# Patient Record
Sex: Female | Born: 1975 | Race: White | Hispanic: No | Marital: Married | Smoking: Former smoker
Health system: Southern US, Community
[De-identification: ages and names within clinical notes are randomized; demographics above are authoritative.]

## PROBLEM LIST (undated history)

## (undated) DIAGNOSIS — F329 Major depressive disorder, single episode, unspecified: Secondary | ICD-10-CM

## (undated) DIAGNOSIS — F32A Depression, unspecified: Secondary | ICD-10-CM

## (undated) DIAGNOSIS — F988 Other specified behavioral and emotional disorders with onset usually occurring in childhood and adolescence: Secondary | ICD-10-CM

## (undated) DIAGNOSIS — F419 Anxiety disorder, unspecified: Secondary | ICD-10-CM

## (undated) DIAGNOSIS — F4310 Post-traumatic stress disorder, unspecified: Secondary | ICD-10-CM

## (undated) DIAGNOSIS — F319 Bipolar disorder, unspecified: Secondary | ICD-10-CM

## (undated) DIAGNOSIS — L209 Atopic dermatitis, unspecified: Secondary | ICD-10-CM

## (undated) DIAGNOSIS — C801 Malignant (primary) neoplasm, unspecified: Secondary | ICD-10-CM

## (undated) DIAGNOSIS — K219 Gastro-esophageal reflux disease without esophagitis: Secondary | ICD-10-CM

## (undated) DIAGNOSIS — C439 Malignant melanoma of skin, unspecified: Secondary | ICD-10-CM

## (undated) DIAGNOSIS — K625 Hemorrhage of anus and rectum: Secondary | ICD-10-CM

## (undated) HISTORY — DX: Depression, unspecified: F32.A

## (undated) HISTORY — DX: Other specified behavioral and emotional disorders with onset usually occurring in childhood and adolescence: F98.8

## (undated) HISTORY — PX: WISDOM TOOTH EXTRACTION: SHX21

## (undated) HISTORY — DX: Post-traumatic stress disorder, unspecified: F43.10

## (undated) HISTORY — DX: Hemorrhage of anus and rectum: K62.5

## (undated) HISTORY — DX: Anxiety disorder, unspecified: F41.9

## (undated) HISTORY — DX: Gastro-esophageal reflux disease without esophagitis: K21.9

## (undated) HISTORY — DX: Bipolar disorder, unspecified: F31.9

## (undated) HISTORY — PX: TONSILLECTOMY AND ADENOIDECTOMY: SUR1326

## (undated) HISTORY — DX: Major depressive disorder, single episode, unspecified: F32.9

## (undated) HISTORY — DX: Malignant melanoma of skin, unspecified: C43.9

## (undated) HISTORY — DX: Atopic dermatitis, unspecified: L20.9

## (undated) HISTORY — DX: Malignant (primary) neoplasm, unspecified: C80.1

---

## 1998-06-18 ENCOUNTER — Emergency Department (HOSPITAL_COMMUNITY): Admission: EM | Admit: 1998-06-18 | Discharge: 1998-06-18 | Payer: Self-pay | Admitting: Emergency Medicine

## 1998-06-18 ENCOUNTER — Encounter: Payer: Self-pay | Admitting: Emergency Medicine

## 1998-11-21 ENCOUNTER — Other Ambulatory Visit: Admission: RE | Admit: 1998-11-21 | Discharge: 1998-11-21 | Payer: Self-pay | Admitting: Obstetrics and Gynecology

## 1999-03-29 ENCOUNTER — Inpatient Hospital Stay (HOSPITAL_COMMUNITY): Admission: AD | Admit: 1999-03-29 | Discharge: 1999-03-29 | Payer: Self-pay | Admitting: Obstetrics and Gynecology

## 1999-04-18 ENCOUNTER — Inpatient Hospital Stay (HOSPITAL_COMMUNITY): Admission: AD | Admit: 1999-04-18 | Discharge: 1999-04-18 | Payer: Self-pay | Admitting: Obstetrics and Gynecology

## 1999-04-25 ENCOUNTER — Inpatient Hospital Stay (HOSPITAL_COMMUNITY): Admission: AD | Admit: 1999-04-25 | Discharge: 1999-04-25 | Payer: Self-pay | Admitting: Obstetrics and Gynecology

## 1999-04-25 ENCOUNTER — Encounter: Payer: Self-pay | Admitting: Obstetrics and Gynecology

## 1999-05-15 ENCOUNTER — Inpatient Hospital Stay (HOSPITAL_COMMUNITY): Admission: AD | Admit: 1999-05-15 | Discharge: 1999-05-15 | Payer: Self-pay | Admitting: *Deleted

## 1999-06-02 ENCOUNTER — Inpatient Hospital Stay (HOSPITAL_COMMUNITY): Admission: AD | Admit: 1999-06-02 | Discharge: 1999-06-04 | Payer: Self-pay | Admitting: Obstetrics and Gynecology

## 1999-08-28 ENCOUNTER — Other Ambulatory Visit: Admission: RE | Admit: 1999-08-28 | Discharge: 1999-08-28 | Payer: Self-pay | Admitting: Obstetrics and Gynecology

## 2000-01-30 HISTORY — PX: TONSILLECTOMY AND ADENOIDECTOMY: SUR1326

## 2000-06-14 ENCOUNTER — Other Ambulatory Visit: Admission: RE | Admit: 2000-06-14 | Discharge: 2000-06-14 | Payer: Self-pay | Admitting: Family Medicine

## 2000-10-22 ENCOUNTER — Encounter (INDEPENDENT_AMBULATORY_CARE_PROVIDER_SITE_OTHER): Payer: Self-pay | Admitting: *Deleted

## 2000-10-22 ENCOUNTER — Ambulatory Visit (HOSPITAL_BASED_OUTPATIENT_CLINIC_OR_DEPARTMENT_OTHER): Admission: RE | Admit: 2000-10-22 | Discharge: 2000-10-22 | Payer: Self-pay | Admitting: Otolaryngology

## 2002-11-24 ENCOUNTER — Emergency Department (HOSPITAL_COMMUNITY): Admission: EM | Admit: 2002-11-24 | Discharge: 2002-11-24 | Payer: Self-pay | Admitting: Emergency Medicine

## 2003-03-08 ENCOUNTER — Emergency Department (HOSPITAL_COMMUNITY): Admission: EM | Admit: 2003-03-08 | Discharge: 2003-03-08 | Payer: Self-pay | Admitting: Emergency Medicine

## 2003-04-13 ENCOUNTER — Other Ambulatory Visit: Admission: RE | Admit: 2003-04-13 | Discharge: 2003-04-13 | Payer: Self-pay | Admitting: Family Medicine

## 2003-08-17 ENCOUNTER — Emergency Department (HOSPITAL_COMMUNITY): Admission: EM | Admit: 2003-08-17 | Discharge: 2003-08-17 | Payer: Self-pay | Admitting: *Deleted

## 2005-05-16 ENCOUNTER — Other Ambulatory Visit: Admission: RE | Admit: 2005-05-16 | Discharge: 2005-05-16 | Payer: Self-pay | Admitting: Family Medicine

## 2009-03-01 ENCOUNTER — Emergency Department (HOSPITAL_COMMUNITY): Admission: EM | Admit: 2009-03-01 | Discharge: 2009-03-01 | Payer: Self-pay | Admitting: Emergency Medicine

## 2009-05-25 ENCOUNTER — Encounter: Admission: RE | Admit: 2009-05-25 | Discharge: 2009-05-25 | Payer: Self-pay | Admitting: Obstetrics and Gynecology

## 2009-07-06 ENCOUNTER — Ambulatory Visit: Payer: Self-pay | Admitting: Nurse Practitioner

## 2009-07-06 ENCOUNTER — Inpatient Hospital Stay (HOSPITAL_COMMUNITY)
Admission: AD | Admit: 2009-07-06 | Discharge: 2009-07-06 | Payer: Self-pay | Source: Home / Self Care | Admitting: Obstetrics and Gynecology

## 2009-10-04 ENCOUNTER — Inpatient Hospital Stay (HOSPITAL_COMMUNITY): Admission: AD | Admit: 2009-10-04 | Discharge: 2009-10-06 | Payer: Self-pay | Admitting: Obstetrics and Gynecology

## 2010-03-02 ENCOUNTER — Other Ambulatory Visit: Payer: Self-pay | Admitting: *Deleted

## 2010-03-28 ENCOUNTER — Other Ambulatory Visit: Payer: Self-pay | Admitting: Orthopedic Surgery

## 2010-03-28 DIAGNOSIS — M5410 Radiculopathy, site unspecified: Secondary | ICD-10-CM

## 2010-03-28 DIAGNOSIS — M542 Cervicalgia: Secondary | ICD-10-CM

## 2010-04-05 ENCOUNTER — Ambulatory Visit
Admission: RE | Admit: 2010-04-05 | Discharge: 2010-04-05 | Disposition: A | Discharge status: Home / Self Care | Payer: PRIVATE HEALTH INSURANCE | Source: Ambulatory Visit | Attending: Orthopedic Surgery | Admitting: Orthopedic Surgery

## 2010-04-05 DIAGNOSIS — M542 Cervicalgia: Secondary | ICD-10-CM

## 2010-04-05 DIAGNOSIS — M5410 Radiculopathy, site unspecified: Secondary | ICD-10-CM

## 2010-04-13 LAB — CBC
Hemoglobin: 9.3 g/dL — ABNORMAL LOW (ref 12.0–15.0)
Hemoglobin: 9.9 g/dL — ABNORMAL LOW (ref 12.0–15.0)
MCH: 30.9 pg (ref 26.0–34.0)
MCHC: 34.5 g/dL (ref 30.0–36.0)
Platelets: 224 10*3/uL (ref 150–400)
RBC: 3.2 MIL/uL — ABNORMAL LOW (ref 3.87–5.11)
RDW: 14.6 % (ref 11.5–15.5)
WBC: 12.8 10*3/uL — ABNORMAL HIGH (ref 4.0–10.5)

## 2010-04-13 LAB — RPR: RPR Ser Ql: NONREACTIVE

## 2010-04-17 LAB — CBC
Platelets: 211 10*3/uL (ref 150–400)
RBC: 3.05 MIL/uL — ABNORMAL LOW (ref 3.87–5.11)
WBC: 10.9 10*3/uL — ABNORMAL HIGH (ref 4.0–10.5)

## 2010-04-17 LAB — COMPREHENSIVE METABOLIC PANEL
ALT: 10 U/L (ref 0–35)
AST: 17 U/L (ref 0–37)
Albumin: 2.9 g/dL — ABNORMAL LOW (ref 3.5–5.2)
Alkaline Phosphatase: 58 U/L (ref 39–117)
Chloride: 106 mEq/L (ref 96–112)
GFR calc Af Amer: >60 mL/min (ref >60)
Potassium: 3.9 mEq/L (ref 3.5–5.1)
Total Bilirubin: 0.3 mg/dL (ref 0.3–1.2)

## 2010-04-17 LAB — URINALYSIS, ROUTINE W REFLEX MICROSCOPIC
Glucose, UA: NEGATIVE mg/dL
Protein, ur: NEGATIVE mg/dL
Specific Gravity, Urine: 1.025 (ref 1.005–1.030)
Urobilinogen, UA: 0.2 mg/dL (ref 0.0–1.0)

## 2010-04-19 LAB — CBC
HCT: 39.2 % (ref 36.0–46.0)
Hemoglobin: 13.3 g/dL (ref 12.0–15.0)
MCHC: 34 g/dL (ref 30.0–36.0)
MCV: 92 fL (ref 78.0–100.0)
Platelets: 219 K/uL (ref 150–400)
RBC: 4.26 MIL/uL (ref 3.87–5.11)
RDW: 12.9 % (ref 11.5–15.5)
WBC: 10.2 10*3/uL (ref 4.0–10.5)

## 2010-04-19 LAB — WET PREP, GENITAL
Trich, Wet Prep: NONE SEEN
Yeast Wet Prep HPF POC: NONE SEEN

## 2010-04-19 LAB — DIFFERENTIAL
Basophils Absolute: 0 K/uL (ref 0.0–0.1)
Basophils Relative: 0 % (ref 0–1)
Eosinophils Absolute: 0 K/uL (ref 0.0–0.7)
Eosinophils Relative: 0 % (ref 0–5)
Lymphocytes Relative: 24 % (ref 12–46)
Lymphs Abs: 2.4 10*3/uL (ref 0.7–4.0)
Monocytes Absolute: 0.6 10*3/uL (ref 0.1–1.0)
Monocytes Relative: 6 % (ref 3–12)
Neutro Abs: 7.1 10*3/uL (ref 1.7–7.7)
Neutrophils Relative %: 70 % (ref 43–77)

## 2010-04-19 LAB — URINALYSIS, ROUTINE W REFLEX MICROSCOPIC
Hgb urine dipstick: NEGATIVE
Protein, ur: NEGATIVE mg/dL
Urobilinogen, UA: 0.2 mg/dL (ref 0.0–1.0)

## 2010-04-19 LAB — GC/CHLAMYDIA PROBE AMP, GENITAL
Chlamydia, DNA Probe: NEGATIVE
GC Probe Amp, Genital: NEGATIVE

## 2010-04-19 LAB — POCT PREGNANCY, URINE: Preg Test, Ur: POSITIVE

## 2010-04-19 LAB — ABO/RH: ABO/RH(D): O POS

## 2010-04-19 LAB — HCG, QUANTITATIVE, PREGNANCY: hCG, Beta Chain, Quant, S: 6456 m[IU]/mL — ABNORMAL HIGH (ref <5)

## 2010-06-16 NOTE — Op Note (Signed)
[place]. [facility]  Patient:    DOROTHIE, WAH Visit Number: [id number] MRN: [id number]          Service Type: DSU Location: [facility] Attending Physician:  Carlean Purl Dictated by:   Kristine Garbe Ezzard Standing, M.D. Proc. Date: 10/22/00 Admit Date:  10/22/2000   CC:         Arvella Merles, M.D.   Operative Report  PREOPERATIVE DIAGNOSIS:  Recurrent chronic tonsillitis.  POSTOPERATIVE DIAGNOSIS:  Recurrent chronic tonsillitis.  OPERATION:  Tonsillectomy and adenoidectomy.  SURGEON:  Kristine Garbe. Ezzard Standing, M.D.  ANESTHESIA:  General endotracheal.  COMPLICATIONS:  None.  BRIEF CLINICAL NOTE:  Cordelia Pen is a 35 year old female who has had history of frequent of upper respiratory infections, as well as sore throat.  On examination she had 2+ size tonsils which are cryptic.  She is taking to the operating room at this time for tonsillectomy and possible adenoidectomy.  DESCRIPTION OF PROCEDURE:  After adequate endotracheal anesthesia, mouth gag was used to expose the oropharynx.  Of note, the patient received 1 g Ancef IV preoperatively, as well as 8 mg Decadron IV preoperatively.  The tonsils were dissected from the tonsil fossae using electrocautery.  Care was taken to preserve the anterior and posterior tonsillar pillars, as well as the uvula. Hemostasis was obtained with cautery.  Following this, a red rubber catheter was passed through the nose and out the mouth to retract soft palate.  The nasopharynx was examined.  Cordelia Pen had moderately large adenoid tissue.  A large adenoid curet was used to remove the central pad of adenoid tissue. Nasopharyngeal packs were placed for hemostasis.  These were then removed. Full hemostasis was obtained with suction cautery.  After obtaining adequate hemostasis the procedure was completed.  Cordelia Pen was awakened from anesthesia and transferred to recovery room; postoperatively is doing well.  DISPOSITION:   Cordelia Pen is discharged to home later this morning; on amoxicillin suspension 500 mg b.i.d. for one week, Tylenol elixir 15-30 cc q.4h. p.r.n. pain, Phenergan p.r.n. nausea.  I will have her follow up in my office in 10-14 days for recheck. Dictated by:   Kristine Garbe Ezzard Standing, M.D. Attending Physician:  Carlean Purl DD:  10/22/00 TD:  10/22/00 Job: [id number]

## 2011-07-27 ENCOUNTER — Other Ambulatory Visit: Payer: Self-pay

## 2011-12-06 ENCOUNTER — Encounter: Payer: Self-pay | Admitting: Gastroenterology

## 2011-12-06 ENCOUNTER — Encounter: Payer: Self-pay | Admitting: Internal Medicine

## 2011-12-06 ENCOUNTER — Ambulatory Visit (INDEPENDENT_AMBULATORY_CARE_PROVIDER_SITE_OTHER): Payer: PRIVATE HEALTH INSURANCE | Admitting: Internal Medicine

## 2011-12-06 ENCOUNTER — Other Ambulatory Visit (INDEPENDENT_AMBULATORY_CARE_PROVIDER_SITE_OTHER): Payer: PRIVATE HEALTH INSURANCE

## 2011-12-06 VITALS — BP 116/72 | HR 79 | Temp 97.8°F | Resp 16 | Ht 65.0 in | Wt 138.0 lb

## 2011-12-06 DIAGNOSIS — Z0000 Encounter for general adult medical examination without abnormal findings: Secondary | ICD-10-CM

## 2011-12-06 DIAGNOSIS — Z23 Encounter for immunization: Secondary | ICD-10-CM

## 2011-12-06 DIAGNOSIS — R1310 Dysphagia, unspecified: Secondary | ICD-10-CM

## 2011-12-06 LAB — CBC WITH DIFFERENTIAL/PLATELET
Basophils Relative: 0.6 % (ref 0.0–3.0)
Eosinophils Relative: 1.2 % (ref 0.0–5.0)
HCT: 42.5 % (ref 36.0–46.0)
Hemoglobin: 14.2 g/dL (ref 12.0–15.0)
Lymphs Abs: 2.7 10*3/uL (ref 0.7–4.0)
MCV: 91.9 fl (ref 78.0–100.0)
Monocytes Relative: 5.1 % (ref 3.0–12.0)
Neutro Abs: 4 10*3/uL (ref 1.4–7.7)
Platelets: 203 10*3/uL (ref 150.0–400.0)
RBC: 4.62 Mil/uL (ref 3.87–5.11)
WBC: 7.2 10*3/uL (ref 4.5–10.5)

## 2011-12-06 LAB — COMPREHENSIVE METABOLIC PANEL
Alkaline Phosphatase: 47 U/L (ref 39–117)
BUN: 7 mg/dL (ref 6–23)
CO2: 27 mEq/L (ref 19–32)
Creatinine, Ser: 0.8 mg/dL (ref 0.4–1.2)
GFR: 89.22 mL/min (ref >60.00)
Glucose, Bld: 85 mg/dL (ref 70–99)
Total Bilirubin: 2.3 mg/dL — ABNORMAL HIGH (ref 0.3–1.2)

## 2011-12-06 LAB — TSH: TSH: 1.01 u[IU]/mL (ref 0.35–5.50)

## 2011-12-06 LAB — LIPID PANEL
HDL: 44.9 mg/dL (ref >39.00)
Triglycerides: 37 mg/dL (ref 0.0–149.0)
VLDL: 7.4 mg/dL (ref 0.0–40.0)

## 2011-12-06 NOTE — Assessment & Plan Note (Signed)
I have asked her to see GI, she does not want a med for heartburn

## 2011-12-06 NOTE — Assessment & Plan Note (Signed)
Exam done, vaccines were updated, labs ordered, pt ed material was given 

## 2011-12-06 NOTE — Progress Notes (Signed)
Subjective:    Patient ID: Wendy Hawkins, female    DOB: Jul 14, 1975, 36 y.o.   MRN: [id number]  Gastrophageal Reflux She complains of abdominal pain (epigastric pain for about 2 years), dysphagia and early satiety. She reports no belching, no chest pain, no choking, no coughing, no globus sensation, no heartburn, no hoarse voice, no nausea, no sore throat, no stridor, no tooth decay, no water brash or no wheezing. This is a chronic problem. The current episode started more than 1 year ago. The problem occurs rarely. The problem has been gradually improving. The symptoms are aggravated by smoking. Associated symptoms include weight loss. Pertinent negatives include no anemia, fatigue, melena, muscle weakness or orthopnea. Risk factors include smoking/tobacco exposure. She has tried nothing for the symptoms.      Review of Systems  Constitutional: Positive for weight loss and unexpected weight change. Negative for fever, chills, diaphoresis, activity change, appetite change and fatigue.  HENT: Positive for trouble swallowing. Negative for sore throat, hoarse voice and voice change.   Eyes: Negative.   Respiratory: Negative for cough, choking, shortness of breath, wheezing and stridor.   Cardiovascular: Negative for chest pain, palpitations and leg swelling.  Gastrointestinal: Positive for dysphagia and abdominal pain (epigastric pain for about 2 years). Negative for heartburn, nausea, vomiting, diarrhea, constipation, blood in stool, melena, abdominal distention, anal bleeding and rectal pain.  Genitourinary: Negative.   Musculoskeletal: Negative for myalgias, back pain, joint swelling, arthralgias, gait problem and muscle weakness.  Skin: Negative.   Neurological: Negative.   Hematological: Negative for adenopathy. Does not bruise/bleed easily.  Psychiatric/Behavioral: Negative for suicidal ideas, hallucinations, behavioral problems, confusion, sleep disturbance, self-injury, dysphoric mood,  decreased concentration and agitation. The patient is nervous/anxious. The patient is not hyperactive.        Objective:   Physical Exam  Vitals reviewed. Constitutional: She is oriented to person, place, and time. She appears well-developed and well-nourished. No distress.  HENT:  Head: Normocephalic and atraumatic.  Mouth/Throat: Oropharynx is clear and moist. No oropharyngeal exudate.  Eyes: Conjunctivae normal are normal. Right eye exhibits no discharge. Left eye exhibits no discharge. No scleral icterus.  Neck: Normal range of motion. Neck supple. No JVD present. No tracheal deviation present. No thyromegaly present.  Cardiovascular: Normal rate, regular rhythm, normal heart sounds and intact distal pulses.  Exam reveals no gallop and no friction rub.   No murmur heard. Pulmonary/Chest: Effort normal and breath sounds normal. No stridor. No respiratory distress. She has no wheezes. She has no rales. She exhibits no tenderness.  Abdominal: Soft. Bowel sounds are normal. She exhibits no distension and no mass. There is no tenderness. There is no rebound and no guarding.  Musculoskeletal: Normal range of motion. She exhibits no edema and no tenderness.  Lymphadenopathy:    She has no cervical adenopathy.  Neurological: She is oriented to person, place, and time.  Skin: Skin is warm and dry. No rash noted. She is not diaphoretic. No erythema. No pallor.  Psychiatric: She has a normal mood and affect. Her behavior is normal. Judgment and thought content normal.     Lab Results  Component Value Date   WBC 12.8* 10/05/2009   HGB 9.3* 10/05/2009   HCT 27.1* 10/05/2009   PLT 220 10/05/2009   GLUCOSE 95 07/06/2009   ALT 10 07/06/2009   AST 17 07/06/2009   NA 137 07/06/2009   K 3.9 07/06/2009   CL 106 07/06/2009   CREATININE 0.47 07/06/2009   BUN 3* 07/06/2009  CO2 25 07/06/2009       Assessment & Plan:

## 2011-12-06 NOTE — Patient Instructions (Signed)
Preventive Care for Adults, Female A healthy lifestyle and preventive care can promote health and wellness. Preventive health guidelines for women include the following key practices.  A routine yearly physical is a good way to check with your caregiver about your health and preventive screening. It is a chance to share any concerns and updates on your health, and to receive a thorough exam.  Visit your dentist for a routine exam and preventive care every 6 months. Brush your teeth twice a day and floss once a day. Good oral hygiene prevents tooth decay and gum disease.  The frequency of eye exams is based on your age, health, family medical history, use of contact lenses, and other factors. Follow your caregiver's recommendations for frequency of eye exams.  Eat a healthy diet. Foods like vegetables, fruits, whole grains, low-fat dairy products, and lean protein foods contain the nutrients you need without too many calories. Decrease your intake of foods high in solid fats, added sugars, and salt. Eat the right amount of calories for you.Get information about a proper diet from your caregiver, if necessary.  Regular physical exercise is one of the most important things you can do for your health. Most adults should get at least 150 minutes of moderate-intensity exercise (any activity that increases your heart rate and causes you to sweat) each week. In addition, most adults need muscle-strengthening exercises on 2 or more days a week.  Maintain a healthy weight. The body mass index (BMI) is a screening tool to identify possible weight problems. It provides an estimate of body fat based on height and weight. Your caregiver can help determine your BMI, and can help you achieve or maintain a healthy weight.For adults 20 years and older:  A BMI below 18.5 is considered underweight.  A BMI of 18.5 to 24.9 is normal.  A BMI of 25 to 29.9 is considered overweight.  A BMI of 30 and above is  considered obese.  Maintain normal blood lipids and cholesterol levels by exercising and minimizing your intake of saturated fat. Eat a balanced diet with plenty of fruit and vegetables. Blood tests for lipids and cholesterol should begin at age 20 and be repeated every 5 years. If your lipid or cholesterol levels are high, you are over 50, or you are at high risk for heart disease, you may need your cholesterol levels checked more frequently.Ongoing high lipid and cholesterol levels should be treated with medicines if diet and exercise are not effective.  If you smoke, find out from your caregiver how to quit. If you do not use tobacco, do not start.  If you are pregnant, do not drink alcohol. If you are breastfeeding, be very cautious about drinking alcohol. If you are not pregnant and choose to drink alcohol, do not exceed 1 drink per day. One drink is considered to be 12 ounces (355 mL) of beer, 5 ounces (148 mL) of wine, or 1.5 ounces (44 mL) of liquor.  Avoid use of street drugs. Do not share needles with anyone. Ask for help if you need support or instructions about stopping the use of drugs.  High blood pressure causes heart disease and increases the risk of stroke. Your blood pressure should be checked at least every 1 to 2 years. Ongoing high blood pressure should be treated with medicines if weight loss and exercise are not effective.  If you are 55 to 35 years old, ask your caregiver if you should take aspirin to prevent strokes.  Diabetes   screening involves taking a blood sample to check your fasting blood sugar level. This should be done once every 3 years, after age 45, if you are within normal weight and without risk factors for diabetes. Testing should be considered at a younger age or be carried out more frequently if you are overweight and have at least 1 risk factor for diabetes.  Breast cancer screening is essential preventive care for women. You should practice "breast  self-awareness." This means understanding the normal appearance and feel of your breasts and may include breast self-examination. Any changes detected, no matter how small, should be reported to a caregiver. Women in their 20s and 30s should have a clinical breast exam (CBE) by a caregiver as part of a regular health exam every 1 to 3 years. After age 40, women should have a CBE every year. Starting at age 40, women should consider having a mammography (breast X-ray test) every year. Women who have a family history of breast cancer should talk to their caregiver about genetic screening. Women at a high risk of breast cancer should talk to their caregivers about having magnetic resonance imaging (MRI) and a mammography every year.  The Pap test is a screening test for cervical cancer. A Pap test can show cell changes on the cervix that might become cervical cancer if left untreated. A Pap test is a procedure in which cells are obtained and examined from the lower end of the uterus (cervix).  Women should have a Pap test starting at age 21.  Between ages 21 and 29, Pap tests should be repeated every 2 years.  Beginning at age 30, you should have a Pap test every 3 years as long as the past 3 Pap tests have been normal.  Some women have medical problems that increase the chance of getting cervical cancer. Talk to your caregiver about these problems. It is especially important to talk to your caregiver if a new problem develops soon after your last Pap test. In these cases, your caregiver may recommend more frequent screening and Pap tests.  The above recommendations are the same for women who have or have not gotten the vaccine for human papillomavirus (HPV).  If you had a hysterectomy for a problem that was not cancer or a condition that could lead to cancer, then you no longer need Pap tests. Even if you no longer need a Pap test, a regular exam is a good idea to make sure no other problems are  starting.  If you are between ages 65 and 70, and you have had normal Pap tests going back 10 years, you no longer need Pap tests. Even if you no longer need a Pap test, a regular exam is a good idea to make sure no other problems are starting.  If you have had past treatment for cervical cancer or a condition that could lead to cancer, you need Pap tests and screening for cancer for at least 20 years after your treatment.  If Pap tests have been discontinued, risk factors (such as a new sexual partner) need to be reassessed to determine if screening should be resumed.  The HPV test is an additional test that may be used for cervical cancer screening. The HPV test looks for the virus that can cause the cell changes on the cervix. The cells collected during the Pap test can be tested for HPV. The HPV test could be used to screen women aged 30 years and older, and should   be used in women of any age who have unclear Pap test results. After the age of 30, women should have HPV testing at the same frequency as a Pap test.  Colorectal cancer can be detected and often prevented. Most routine colorectal cancer screening begins at the age of 50 and continues through age 75. However, your caregiver may recommend screening at an earlier age if you have risk factors for colon cancer. On a yearly basis, your caregiver may provide home test kits to check for hidden blood in the stool. Use of a small camera at the end of a tube, to directly examine the colon (sigmoidoscopy or colonoscopy), can detect the earliest forms of colorectal cancer. Talk to your caregiver about this at age 50, when routine screening begins. Direct examination of the colon should be repeated every 5 to 10 years through age 75, unless early forms of pre-cancerous polyps or small growths are found.  Hepatitis C blood testing is recommended for all people born from 1945 through 1965 and any individual with known risks for hepatitis C.  Practice  safe sex. Use condoms and avoid high-risk sexual practices to reduce the spread of sexually transmitted infections (STIs). STIs include gonorrhea, chlamydia, syphilis, trichomonas, herpes, HPV, and human immunodeficiency virus (HIV). Herpes, HIV, and HPV are viral illnesses that have no cure. They can result in disability, cancer, and death. Sexually active women aged 25 and younger should be checked for chlamydia. Older women with new or multiple partners should also be tested for chlamydia. Testing for other STIs is recommended if you are sexually active and at increased risk.  Osteoporosis is a disease in which the bones lose minerals and strength with aging. This can result in serious bone fractures. The risk of osteoporosis can be identified using a bone density scan. Women ages 65 and over and women at risk for fractures or osteoporosis should discuss screening with their caregivers. Ask your caregiver whether you should take a calcium supplement or vitamin D to reduce the rate of osteoporosis.  Menopause can be associated with physical symptoms and risks. Hormone replacement therapy is available to decrease symptoms and risks. You should talk to your caregiver about whether hormone replacement therapy is right for you.  Use sunscreen with sun protection factor (SPF) of 30 or more. Apply sunscreen liberally and repeatedly throughout the day. You should seek shade when your shadow is shorter than you. Protect yourself by wearing long sleeves, pants, a wide-brimmed hat, and sunglasses year round, whenever you are outdoors.  Once a month, do a whole body skin exam, using a mirror to look at the skin on your back. Notify your caregiver of new moles, moles that have irregular borders, moles that are larger than a pencil eraser, or moles that have changed in shape or color.  Stay current with required immunizations.  Influenza. You need a dose every fall (or winter). The composition of the flu vaccine  changes each year, so being vaccinated once is not enough.  Pneumococcal polysaccharide. You need 1 to 2 doses if you smoke cigarettes or if you have certain chronic medical conditions. You need 1 dose at age 65 (or older) if you have never been vaccinated.  Tetanus, diphtheria, pertussis (Tdap, Td). Get 1 dose of Tdap vaccine if you are younger than age 65, are over 65 and have contact with an infant, are a healthcare worker, are pregnant, or simply want to be protected from whooping cough. After that, you need a Td   booster dose every 10 years. Consult your caregiver if you have not had at least 3 tetanus and diphtheria-containing shots sometime in your life or have a deep or dirty wound.  HPV. You need this vaccine if you are a woman age 26 or younger. The vaccine is given in 3 doses over 6 months.  Measles, mumps, rubella (MMR). You need at least 1 dose of MMR if you were born in 1957 or later. You may also need a second dose.  Meningococcal. If you are age 19 to 21 and a first-year college student living in a residence hall, or have one of several medical conditions, you need to get vaccinated against meningococcal disease. You may also need additional booster doses.  Zoster (shingles). If you are age 60 or older, you should get this vaccine.  Varicella (chickenpox). If you have never had chickenpox or you were vaccinated but received only 1 dose, talk to your caregiver to find out if you need this vaccine.  Hepatitis A. You need this vaccine if you have a specific risk factor for hepatitis A virus infection or you simply wish to be protected from this disease. The vaccine is usually given as 2 doses, 6 to 18 months apart.  Hepatitis B. You need this vaccine if you have a specific risk factor for hepatitis B virus infection or you simply wish to be protected from this disease. The vaccine is given in 3 doses, usually over 6 months. Preventive Services / Frequency Ages 19 to 39  Blood  pressure check.** / Every 1 to 2 years.  Lipid and cholesterol check.** / Every 5 years beginning at age 20.  Clinical breast exam.** / Every 3 years for women in their 20s and 30s.  Pap test.** / Every 2 years from ages 21 through 29. Every 3 years starting at age 30 through age 65 or 70 with a history of 3 consecutive normal Pap tests.  HPV screening.** / Every 3 years from ages 30 through ages 65 to 70 with a history of 3 consecutive normal Pap tests.  Hepatitis C blood test.** / For any individual with known risks for hepatitis C.  Skin self-exam. / Monthly.  Influenza immunization.** / Every year.  Pneumococcal polysaccharide immunization.** / 1 to 2 doses if you smoke cigarettes or if you have certain chronic medical conditions.  Tetanus, diphtheria, pertussis (Tdap, Td) immunization. / A one-time dose of Tdap vaccine. After that, you need a Td booster dose every 10 years.  HPV immunization. / 3 doses over 6 months, if you are 26 and younger.  Measles, mumps, rubella (MMR) immunization. / You need at least 1 dose of MMR if you were born in 1957 or later. You may also need a second dose.  Meningococcal immunization. / 1 dose if you are age 19 to 21 and a first-year college student living in a residence hall, or have one of several medical conditions, you need to get vaccinated against meningococcal disease. You may also need additional booster doses.  Varicella immunization.** / Consult your caregiver.  Hepatitis A immunization.** / Consult your caregiver. 2 doses, 6 to 18 months apart.  Hepatitis B immunization.** / Consult your caregiver. 3 doses usually over 6 months. Ages 40 to 64  Blood pressure check.** / Every 1 to 2 years.  Lipid and cholesterol check.** / Every 5 years beginning at age 20.  Clinical breast exam.** / Every year after age 40.  Mammogram.** / Every year beginning at age 40   and continuing for as long as you are in good health. Consult with your  caregiver.  Pap test.** / Every 3 years starting at age 30 through age 65 or 70 with a history of 3 consecutive normal Pap tests.  HPV screening.** / Every 3 years from ages 30 through ages 65 to 70 with a history of 3 consecutive normal Pap tests.  Fecal occult blood test (FOBT) of stool. / Every year beginning at age 50 and continuing until age 75. You may not need to do this test if you get a colonoscopy every 10 years.  Flexible sigmoidoscopy or colonoscopy.** / Every 5 years for a flexible sigmoidoscopy or every 10 years for a colonoscopy beginning at age 50 and continuing until age 75.  Hepatitis C blood test.** / For all people born from 1945 through 1965 and any individual with known risks for hepatitis C.  Skin self-exam. / Monthly.  Influenza immunization.** / Every year.  Pneumococcal polysaccharide immunization.** / 1 to 2 doses if you smoke cigarettes or if you have certain chronic medical conditions.  Tetanus, diphtheria, pertussis (Tdap, Td) immunization.** / A one-time dose of Tdap vaccine. After that, you need a Td booster dose every 10 years.  Measles, mumps, rubella (MMR) immunization. / You need at least 1 dose of MMR if you were born in 1957 or later. You may also need a second dose.  Varicella immunization.** / Consult your caregiver.  Meningococcal immunization.** / Consult your caregiver.  Hepatitis A immunization.** / Consult your caregiver. 2 doses, 6 to 18 months apart.  Hepatitis B immunization.** / Consult your caregiver. 3 doses, usually over 6 months. Ages 65 and over  Blood pressure check.** / Every 1 to 2 years.  Lipid and cholesterol check.** / Every 5 years beginning at age 20.  Clinical breast exam.** / Every year after age 40.  Mammogram.** / Every year beginning at age 40 and continuing for as long as you are in good health. Consult with your caregiver.  Pap test.** / Every 3 years starting at age 30 through age 65 or 70 with a 3  consecutive normal Pap tests. Testing can be stopped between 65 and 70 with 3 consecutive normal Pap tests and no abnormal Pap or HPV tests in the past 10 years.  HPV screening.** / Every 3 years from ages 30 through ages 65 or 70 with a history of 3 consecutive normal Pap tests. Testing can be stopped between 65 and 70 with 3 consecutive normal Pap tests and no abnormal Pap or HPV tests in the past 10 years.  Fecal occult blood test (FOBT) of stool. / Every year beginning at age 50 and continuing until age 75. You may not need to do this test if you get a colonoscopy every 10 years.  Flexible sigmoidoscopy or colonoscopy.** / Every 5 years for a flexible sigmoidoscopy or every 10 years for a colonoscopy beginning at age 50 and continuing until age 75.  Hepatitis C blood test.** / For all people born from 1945 through 1965 and any individual with known risks for hepatitis C.  Osteoporosis screening.** / A one-time screening for women ages 65 and over and women at risk for fractures or osteoporosis.  Skin self-exam. / Monthly.  Influenza immunization.** / Every year.  Pneumococcal polysaccharide immunization.** / 1 dose at age 65 (or older) if you have never been vaccinated.  Tetanus, diphtheria, pertussis (Tdap, Td) immunization. / A one-time dose of Tdap vaccine if you are over   65 and have contact with an infant, are a healthcare worker, or simply want to be protected from whooping cough. After that, you need a Td booster dose every 10 years.  Varicella immunization.** / Consult your caregiver.  Meningococcal immunization.** / Consult your caregiver.  Hepatitis A immunization.** / Consult your caregiver. 2 doses, 6 to 18 months apart.  Hepatitis B immunization.** / Check with your caregiver. 3 doses, usually over 6 months. ** Family history and personal history of risk and conditions may change your caregiver's recommendations. Document Released: 03/13/2001 Document Revised: 04/09/2011  Document Reviewed: 06/12/2010 [employer] Patient Information 2013 [employer]. Smoking Cessation Quitting smoking is important to your health and has many advantages. However, it is not always easy to quit since nicotine is a very addictive drug. Often times, people try 3 times or more before being able to quit. This document explains the best ways for you to prepare to quit smoking. Quitting takes hard work and a lot of effort, but you can do it. ADVANTAGES OF QUITTING SMOKING  You will live longer, feel better, and live better.  Your body will feel the impact of quitting smoking almost immediately.  Within 20 minutes, blood pressure decreases. Your pulse returns to its normal level.  After 8 hours, carbon monoxide levels in the blood return to normal. Your oxygen level increases.  After 24 hours, the chance of having a heart attack starts to decrease. Your breath, hair, and body stop smelling like smoke.  After 48 hours, damaged nerve endings begin to recover. Your sense of taste and smell improve.  After 72 hours, the body is virtually free of nicotine. Your bronchial tubes relax and breathing becomes easier.  After 2 to 12 weeks, lungs can hold more air. Exercise becomes easier and circulation improves.  The risk of having a heart attack, stroke, cancer, or lung disease is greatly reduced.  After 1 year, the risk of coronary heart disease is cut in half.  After 5 years, the risk of stroke falls to the same as a nonsmoker.  After 10 years, the risk of lung cancer is cut in half and the risk of other cancers decreases significantly.  After 15 years, the risk of coronary heart disease drops, usually to the level of a nonsmoker.  If you are pregnant, quitting smoking will improve your chances of having a healthy baby.  The people you live with, especially any children, will be healthier.  You will have extra money to spend on things other than cigarettes. QUESTIONS TO THINK  ABOUT BEFORE ATTEMPTING TO QUIT You may want to talk about your answers with your caregiver.  Why do you want to quit?  If you tried to quit in the past, what helped and what did not?  What will be the most difficult situations for you after you quit? How will you plan to handle them?  Who can help you through the tough times? Your family? Friends? A caregiver?  What pleasures do you get from smoking? What ways can you still get pleasure if you quit? Here are some questions to ask your caregiver:  How can you help me to be successful at quitting?  What medicine do you think would be best for me and how should I take it?  What should I do if I need more help?  What is smoking withdrawal like? How can I get information on withdrawal? GET READY  Set a quit date.  Change your environment by getting rid of   all cigarettes, ashtrays, matches, and lighters in your home, car, or work. Do not let people smoke in your home.  Review your past attempts to quit. Think about what worked and what did not. GET SUPPORT AND ENCOURAGEMENT You have a better chance of being successful if you have help. You can get support in many ways.  Tell your family, friends, and co-workers that you are going to quit and need their support. Ask them not to smoke around you.  Get individual, group, or telephone counseling and support. Programs are available at local hospitals and health centers. Call your local health department for information about programs in your area.  Spiritual beliefs and practices may help some smokers quit.  Download a "quit meter" on your computer to keep track of quit statistics, such as how long you have gone without smoking, cigarettes not smoked, and money saved.  Get a self-help book about quitting smoking and staying off of tobacco. LEARN NEW SKILLS AND BEHAVIORS  Distract yourself from urges to smoke. Talk to someone, go for a walk, or occupy your time with a task.  Change  your normal routine. Take a different route to work. Drink tea instead of coffee. Eat breakfast in a different place.  Reduce your stress. Take a hot bath, exercise, or read a book.  Plan something enjoyable to do every day. Reward yourself for not smoking.  Explore interactive web-based programs that specialize in helping you quit. GET MEDICINE AND USE IT CORRECTLY Medicines can help you stop smoking and decrease the urge to smoke. Combining medicine with the above behavioral methods and support can greatly increase your chances of successfully quitting smoking.  Nicotine replacement therapy helps deliver nicotine to your body without the negative effects and risks of smoking. Nicotine replacement therapy includes nicotine gum, lozenges, inhalers, nasal sprays, and skin patches. Some may be available over-the-counter and others require a prescription.  Antidepressant medicine helps people abstain from smoking, but how this works is unknown. This medicine is available by prescription.  Nicotinic receptor partial agonist medicine simulates the effect of nicotine in your brain. This medicine is available by prescription. Ask your caregiver for advice about which medicines to use and how to use them based on your health history. Your caregiver will tell you what side effects to look out for if you choose to be on a medicine or therapy. Carefully read the information on the package. Do not use any other product containing nicotine while using a nicotine replacement product.  RELAPSE OR DIFFICULT SITUATIONS Most relapses occur within the first 3 months after quitting. Do not be discouraged if you start smoking again. Remember, most people try several times before finally quitting. You may have symptoms of withdrawal because your body is used to nicotine. You may crave cigarettes, be irritable, feel very hungry, cough often, get headaches, or have difficulty concentrating. The withdrawal symptoms are only  temporary. They are strongest when you first quit, but they will go away within 10 14 days. To reduce the chances of relapse, try to:  Avoid drinking alcohol. Drinking lowers your chances of successfully quitting.  Reduce the amount of caffeine you consume. Once you quit smoking, the amount of caffeine in your body increases and can give you symptoms, such as a rapid heartbeat, sweating, and anxiety.  Avoid smokers because they can make you want to smoke.  Do not let weight gain distract you. Many smokers will gain weight when they quit, usually less than 10 pounds.   Eat a healthy diet and stay active. You can always lose the weight gained after you quit.  Find ways to improve your mood other than smoking. FOR MORE INFORMATION  www.smokefree.gov  Document Released: 01/09/2001 Document Revised: 07/17/2011 Document Reviewed: 04/26/2011 [employer] Patient Information 2013 [employer].  

## 2011-12-10 ENCOUNTER — Telehealth: Payer: Self-pay

## 2011-12-10 NOTE — Telephone Encounter (Signed)
Dr Yetta Barre has reviewed the labs. Bilirubin may go up sometimes temporarily. Keep regular f/up appt w/Dr Geralyn Flash

## 2011-12-10 NOTE — Telephone Encounter (Signed)
Patient called stating that she received a lab result letter that showd an out of range bilirubin of 2.3. She would like to know what does this mean and what she need to do about it. Please advise Thanks

## 2011-12-11 NOTE — Telephone Encounter (Signed)
Patient notified per MD.

## 2012-01-01 ENCOUNTER — Encounter: Payer: Self-pay | Admitting: Gastroenterology

## 2012-01-01 ENCOUNTER — Ambulatory Visit (INDEPENDENT_AMBULATORY_CARE_PROVIDER_SITE_OTHER): Payer: PRIVATE HEALTH INSURANCE | Admitting: Gastroenterology

## 2012-01-01 VITALS — BP 132/90 | HR 92 | Temp 98.2°F | Ht 65.0 in | Wt 141.0 lb

## 2012-01-01 DIAGNOSIS — R1310 Dysphagia, unspecified: Secondary | ICD-10-CM

## 2012-01-01 DIAGNOSIS — M949 Disorder of cartilage, unspecified: Secondary | ICD-10-CM

## 2012-01-01 DIAGNOSIS — R079 Chest pain, unspecified: Secondary | ICD-10-CM

## 2012-01-01 DIAGNOSIS — R0789 Other chest pain: Secondary | ICD-10-CM

## 2012-01-01 DIAGNOSIS — M899 Disorder of bone, unspecified: Secondary | ICD-10-CM

## 2012-01-01 MED ORDER — SULINDAC 200 MG PO TABS: 200.0000 mg | ORAL_TABLET | Freq: Two times a day (BID) | ORAL | Status: DC

## 2012-01-01 NOTE — Progress Notes (Signed)
History of Present Illness: 36yo WF here for evaluation of lower chest pain.  Symptoms began 1 year ago.  Pain may be spontaneous with radiation to the RUQ.  She was told to have inflammation of the xyphoid process and given antiinflammatories for 1 month without relief.  Pain may worsen with lifting or by palpating area.  She denies pyrosis but my have some dysphagia to solids, with mild odynophagia.  She denies nausea or vomiting.    Past Medical History  Diagnosis Date  . GERD (gastroesophageal reflux disease)   . Anxiety   . Depression     Dr. Marisue Brooklyn   Past Surgical History  Procedure Date  . Tonsillectomy and adenoidectomy   . Wisdom tooth extraction    family history includes Heart disease in her father; Hypertension in her father; and Lung cancer in her father.  There is no history of Alcohol abuse, and Diabetes, and Drug abuse, and Early death, and Hyperlipidemia, and Kidney disease, and Stroke, . Current Outpatient Prescriptions  Medication Sig Dispense Refill  . ALPRAZolam (NIRAVAM) 0.5 MG dissolvable tablet Take 1 tablet (0.5 mg total) by mouth 2 (two) times daily as needed for anxiety.  20 tablet  0  . amphetamine-dextroamphetamine (ADDERALL XR) 20 MG 24 hr capsule Take 1 capsule (20 mg total) by mouth every morning.  30 capsule  0  . lamoTRIgine (LAMICTAL) 100 MG tablet Take 1 tablet (100 mg total) by mouth daily.  30 tablet    . levonorgestrel (MIRENA) 20 MCG/24HR IUD 1 Intra Uterine Device (1 each total) by Intrauterine route once.  1 each  0   Allergies as of 01/01/2012 - Review Complete 01/01/2012  Allergen Reaction Noted  . Codeine  12/06/2011  . Depo-medrol (methylprednisolone acetate)  12/06/2011    reports that she has been smoking Cigarettes.  She has a 20 pack-year smoking history. She has never used smokeless tobacco. She reports that she drinks about 1.2 ounces of alcohol per week. She reports that she does not use illicit drugs.     Review of  Systems: Pertinent positive and negative review of systems were noted in the above HPI section. All other review of systems were otherwise negative.  Vital signs were reviewed in today's medical record Physical Exam: General: Well developed , well nourished, no acute distress Head: Normocephalic and atraumatic Eyes:  sclerae anicteric, EOMI Ears: Normal auditory acuity Mouth: No deformity or lesions Neck: Supple, no masses or thyromegaly Lungs: Clear throughout to auscultation Heart: Regular rate and rhythm; no murmurs, rubs or bruits Abdomen: Soft, non tender and non distended. No masses, hepatosplenomegaly or hernias noted. Normal Bowel sounds Rectal:deferred Musculoskeletal: Symmetrical with no gross deformities; there is marked tenderness in the subxiphoid area and especially over the tip of the xiphoid process. Palpation reproduces her pain. Skin: No lesions on visible extremities Pulses:  Normal pulses noted Extremities: No clubbing, cyanosis, edema or deformities noted Neurological: Alert oriented x 4, grossly nonfocal Cervical Nodes:  No significant cervical adenopathy Inguinal Nodes: No significant inguinal adenopathy Psychological:  Alert and cooperative. Normal mood and affect

## 2012-01-01 NOTE — Patient Instructions (Addendum)
You will go to the basement for a chest xray today

## 2012-01-01 NOTE — Assessment & Plan Note (Signed)
Persistent lower chest pain is probably musculoskeletal pain, possibly due to costochondritis. Pain from prior esophageal disease including stricture or esophagitis is less likely.  Recommendations #1 chest x-ray #2 trial of Clinoril 200 mg twice a day; if not improved within the next 2 weeks then I would proceed with endoscopy

## 2012-01-01 NOTE — Assessment & Plan Note (Addendum)
What she describes as dysphagia may be discomfort related to pressure on the xiphoid. An esophageal stricture is less likely.  Recommendations #1 if patient does not improve with a trial of NSAIDs  I will proceed with endoscopy and dilate if a stricture is seen.

## 2012-01-02 ENCOUNTER — Ambulatory Visit
Admission: RE | Admit: 2012-01-02 | Discharge: 2012-01-02 | Disposition: A | Discharge status: Home / Self Care | Payer: PRIVATE HEALTH INSURANCE | Source: Ambulatory Visit | Attending: Gastroenterology | Admitting: Gastroenterology

## 2012-01-02 DIAGNOSIS — R0789 Other chest pain: Secondary | ICD-10-CM

## 2012-01-02 DIAGNOSIS — R079 Chest pain, unspecified: Secondary | ICD-10-CM

## 2012-01-03 NOTE — Progress Notes (Signed)
Quick Note:  Please inform the patient that chest x-ray was normal and to continue current plan of action ______ 

## 2012-09-25 ENCOUNTER — Other Ambulatory Visit (INDEPENDENT_AMBULATORY_CARE_PROVIDER_SITE_OTHER): Payer: PRIVATE HEALTH INSURANCE

## 2012-09-25 ENCOUNTER — Encounter: Payer: Self-pay | Admitting: Internal Medicine

## 2012-09-25 ENCOUNTER — Ambulatory Visit (INDEPENDENT_AMBULATORY_CARE_PROVIDER_SITE_OTHER): Payer: PRIVATE HEALTH INSURANCE | Admitting: Internal Medicine

## 2012-09-25 VITALS — BP 110/80 | HR 83 | Temp 98.1°F | Resp 16 | Wt 140.0 lb

## 2012-09-25 DIAGNOSIS — Z0000 Encounter for general adult medical examination without abnormal findings: Secondary | ICD-10-CM

## 2012-09-25 DIAGNOSIS — F4310 Post-traumatic stress disorder, unspecified: Secondary | ICD-10-CM

## 2012-09-25 LAB — CBC WITH DIFFERENTIAL/PLATELET
Basophils Absolute: 0 10*3/uL (ref 0.0–0.1)
Basophils Relative: 0.6 % (ref 0.0–3.0)
HCT: 41 % (ref 36.0–46.0)
Hemoglobin: 14.2 g/dL (ref 12.0–15.0)
Lymphs Abs: 3.1 10*3/uL (ref 0.7–4.0)
MCHC: 34.5 g/dL (ref 30.0–36.0)
Monocytes Relative: 5.5 % (ref 3.0–12.0)
Neutro Abs: 4.4 10*3/uL (ref 1.4–7.7)
RBC: 4.59 Mil/uL (ref 3.87–5.11)
RDW: 13.1 % (ref 11.5–14.6)

## 2012-09-25 LAB — URINALYSIS, ROUTINE W REFLEX MICROSCOPIC
Ketones, ur: NEGATIVE
pH: 6 (ref 5.0–8.0)

## 2012-09-25 MED ORDER — LAMOTRIGINE 100 MG PO TABS: 100.0000 mg | ORAL_TABLET | Freq: Every day | ORAL | Status: DC

## 2012-09-25 NOTE — Patient Instructions (Signed)
Preventive Care for Adults, Female A healthy lifestyle and preventive care can promote health and wellness. Preventive health guidelines for women include the following key practices.  A routine yearly physical is a good way to check with your caregiver about your health and preventive screening. It is a chance to share any concerns and updates on your health, and to receive a thorough exam.  Visit your dentist for a routine exam and preventive care every 6 months. Brush your teeth twice a day and floss once a day. Good oral hygiene prevents tooth decay and gum disease.  The frequency of eye exams is based on your age, health, family medical history, use of contact lenses, and other factors. Follow your caregiver's recommendations for frequency of eye exams.  Eat a healthy diet. Foods like vegetables, fruits, whole grains, low-fat dairy products, and lean protein foods contain the nutrients you need without too many calories. Decrease your intake of foods high in solid fats, added sugars, and salt. Eat the right amount of calories for you.Get information about a proper diet from your caregiver, if necessary.  Regular physical exercise is one of the most important things you can do for your health. Most adults should get at least 150 minutes of moderate-intensity exercise (any activity that increases your heart rate and causes you to sweat) each week. In addition, most adults need muscle-strengthening exercises on 2 or more days a week.  Maintain a healthy weight. The body mass index (BMI) is a screening tool to identify possible weight problems. It provides an estimate of body fat based on height and weight. Your caregiver can help determine your BMI, and can help you achieve or maintain a healthy weight.For adults 20 years and older:  A BMI below 18.5 is considered underweight.  A BMI of 18.5 to 24.9 is normal.  A BMI of 25 to 29.9 is considered overweight.  A BMI of 30 and above is  considered obese.  Maintain normal blood lipids and cholesterol levels by exercising and minimizing your intake of saturated fat. Eat a balanced diet with plenty of fruit and vegetables. Blood tests for lipids and cholesterol should begin at age 20 and be repeated every 5 years. If your lipid or cholesterol levels are high, you are over 50, or you are at high risk for heart disease, you may need your cholesterol levels checked more frequently.Ongoing high lipid and cholesterol levels should be treated with medicines if diet and exercise are not effective.  If you smoke, find out from your caregiver how to quit. If you do not use tobacco, do not start.  If you are pregnant, do not drink alcohol. If you are breastfeeding, be very cautious about drinking alcohol. If you are not pregnant and choose to drink alcohol, do not exceed 1 drink per day. One drink is considered to be 12 ounces (355 mL) of beer, 5 ounces (148 mL) of wine, or 1.5 ounces (44 mL) of liquor.  Avoid use of street drugs. Do not share needles with anyone. Ask for help if you need support or instructions about stopping the use of drugs.  High blood pressure causes heart disease and increases the risk of stroke. Your blood pressure should be checked at least every 1 to 2 years. Ongoing high blood pressure should be treated with medicines if weight loss and exercise are not effective.  If you are 55 to 36 years old, ask your caregiver if you should take aspirin to prevent strokes.  Diabetes   screening involves taking a blood sample to check your fasting blood sugar level. This should be done once every 3 years, after age 45, if you are within normal weight and without risk factors for diabetes. Testing should be considered at a younger age or be carried out more frequently if you are overweight and have at least 1 risk factor for diabetes.  Breast cancer screening is essential preventive care for women. You should practice "breast  self-awareness." This means understanding the normal appearance and feel of your breasts and may include breast self-examination. Any changes detected, no matter how small, should be reported to a caregiver. Women in their 20s and 30s should have a clinical breast exam (CBE) by a caregiver as part of a regular health exam every 1 to 3 years. After age 40, women should have a CBE every year. Starting at age 40, women should consider having a mammography (breast X-ray test) every year. Women who have a family history of breast cancer should talk to their caregiver about genetic screening. Women at a high risk of breast cancer should talk to their caregivers about having magnetic resonance imaging (MRI) and a mammography every year.  The Pap test is a screening test for cervical cancer. A Pap test can show cell changes on the cervix that might become cervical cancer if left untreated. A Pap test is a procedure in which cells are obtained and examined from the lower end of the uterus (cervix).  Women should have a Pap test starting at age 21.  Between ages 21 and 29, Pap tests should be repeated every 2 years.  Beginning at age 30, you should have a Pap test every 3 years as long as the past 3 Pap tests have been normal.  Some women have medical problems that increase the chance of getting cervical cancer. Talk to your caregiver about these problems. It is especially important to talk to your caregiver if a new problem develops soon after your last Pap test. In these cases, your caregiver may recommend more frequent screening and Pap tests.  The above recommendations are the same for women who have or have not gotten the vaccine for human papillomavirus (HPV).  If you had a hysterectomy for a problem that was not cancer or a condition that could lead to cancer, then you no longer need Pap tests. Even if you no longer need a Pap test, a regular exam is a good idea to make sure no other problems are  starting.  If you are between ages 65 and 70, and you have had normal Pap tests going back 10 years, you no longer need Pap tests. Even if you no longer need a Pap test, a regular exam is a good idea to make sure no other problems are starting.  If you have had past treatment for cervical cancer or a condition that could lead to cancer, you need Pap tests and screening for cancer for at least 20 years after your treatment.  If Pap tests have been discontinued, risk factors (such as a new sexual partner) need to be reassessed to determine if screening should be resumed.  The HPV test is an additional test that may be used for cervical cancer screening. The HPV test looks for the virus that can cause the cell changes on the cervix. The cells collected during the Pap test can be tested for HPV. The HPV test could be used to screen women aged 30 years and older, and should   be used in women of any age who have unclear Pap test results. After the age of 30, women should have HPV testing at the same frequency as a Pap test.  Colorectal cancer can be detected and often prevented. Most routine colorectal cancer screening begins at the age of 50 and continues through age 75. However, your caregiver may recommend screening at an earlier age if you have risk factors for colon cancer. On a yearly basis, your caregiver may provide home test kits to check for hidden blood in the stool. Use of a small camera at the end of a tube, to directly examine the colon (sigmoidoscopy or colonoscopy), can detect the earliest forms of colorectal cancer. Talk to your caregiver about this at age 50, when routine screening begins. Direct examination of the colon should be repeated every 5 to 10 years through age 75, unless early forms of pre-cancerous polyps or small growths are found.  Hepatitis C blood testing is recommended for all people born from 1945 through 1965 and any individual with known risks for hepatitis C.  Practice  safe sex. Use condoms and avoid high-risk sexual practices to reduce the spread of sexually transmitted infections (STIs). STIs include gonorrhea, chlamydia, syphilis, trichomonas, herpes, HPV, and human immunodeficiency virus (HIV). Herpes, HIV, and HPV are viral illnesses that have no cure. They can result in disability, cancer, and death. Sexually active women aged 25 and younger should be checked for chlamydia. Older women with new or multiple partners should also be tested for chlamydia. Testing for other STIs is recommended if you are sexually active and at increased risk.  Osteoporosis is a disease in which the bones lose minerals and strength with aging. This can result in serious bone fractures. The risk of osteoporosis can be identified using a bone density scan. Women ages 65 and over and women at risk for fractures or osteoporosis should discuss screening with their caregivers. Ask your caregiver whether you should take a calcium supplement or vitamin D to reduce the rate of osteoporosis.  Menopause can be associated with physical symptoms and risks. Hormone replacement therapy is available to decrease symptoms and risks. You should talk to your caregiver about whether hormone replacement therapy is right for you.  Use sunscreen with sun protection factor (SPF) of 30 or more. Apply sunscreen liberally and repeatedly throughout the day. You should seek shade when your shadow is shorter than you. Protect yourself by wearing long sleeves, pants, a wide-brimmed hat, and sunglasses year round, whenever you are outdoors.  Once a month, do a whole body skin exam, using a mirror to look at the skin on your back. Notify your caregiver of new moles, moles that have irregular borders, moles that are larger than a pencil eraser, or moles that have changed in shape or color.  Stay current with required immunizations.  Influenza. You need a dose every fall (or winter). The composition of the flu vaccine  changes each year, so being vaccinated once is not enough.  Pneumococcal polysaccharide. You need 1 to 2 doses if you smoke cigarettes or if you have certain chronic medical conditions. You need 1 dose at age 65 (or older) if you have never been vaccinated.  Tetanus, diphtheria, pertussis (Tdap, Td). Get 1 dose of Tdap vaccine if you are younger than age 65, are over 65 and have contact with an infant, are a healthcare worker, are pregnant, or simply want to be protected from whooping cough. After that, you need a Td   booster dose every 10 years. Consult your caregiver if you have not had at least 3 tetanus and diphtheria-containing shots sometime in your life or have a deep or dirty wound.  HPV. You need this vaccine if you are a woman age 26 or younger. The vaccine is given in 3 doses over 6 months.  Measles, mumps, rubella (MMR). You need at least 1 dose of MMR if you were born in 1957 or later. You may also need a second dose.  Meningococcal. If you are age 19 to 21 and a first-year college student living in a residence hall, or have one of several medical conditions, you need to get vaccinated against meningococcal disease. You may also need additional booster doses.  Zoster (shingles). If you are age 60 or older, you should get this vaccine.  Varicella (chickenpox). If you have never had chickenpox or you were vaccinated but received only 1 dose, talk to your caregiver to find out if you need this vaccine.  Hepatitis A. You need this vaccine if you have a specific risk factor for hepatitis A virus infection or you simply wish to be protected from this disease. The vaccine is usually given as 2 doses, 6 to 18 months apart.  Hepatitis B. You need this vaccine if you have a specific risk factor for hepatitis B virus infection or you simply wish to be protected from this disease. The vaccine is given in 3 doses, usually over 6 months. Preventive Services / Frequency Ages 19 to 39  Blood  pressure check.** / Every 1 to 2 years.  Lipid and cholesterol check.** / Every 5 years beginning at age 20.  Clinical breast exam.** / Every 3 years for women in their 20s and 30s.  Pap test.** / Every 2 years from ages 21 through 29. Every 3 years starting at age 30 through age 65 or 70 with a history of 3 consecutive normal Pap tests.  HPV screening.** / Every 3 years from ages 30 through ages 65 to 70 with a history of 3 consecutive normal Pap tests.  Hepatitis C blood test.** / For any individual with known risks for hepatitis C.  Skin self-exam. / Monthly.  Influenza immunization.** / Every year.  Pneumococcal polysaccharide immunization.** / 1 to 2 doses if you smoke cigarettes or if you have certain chronic medical conditions.  Tetanus, diphtheria, pertussis (Tdap, Td) immunization. / A one-time dose of Tdap vaccine. After that, you need a Td booster dose every 10 years.  HPV immunization. / 3 doses over 6 months, if you are 26 and younger.  Measles, mumps, rubella (MMR) immunization. / You need at least 1 dose of MMR if you were born in 1957 or later. You may also need a second dose.  Meningococcal immunization. / 1 dose if you are age 19 to 21 and a first-year college student living in a residence hall, or have one of several medical conditions, you need to get vaccinated against meningococcal disease. You may also need additional booster doses.  Varicella immunization.** / Consult your caregiver.  Hepatitis A immunization.** / Consult your caregiver. 2 doses, 6 to 18 months apart.  Hepatitis B immunization.** / Consult your caregiver. 3 doses usually over 6 months. Ages 40 to 64  Blood pressure check.** / Every 1 to 2 years.  Lipid and cholesterol check.** / Every 5 years beginning at age 20.  Clinical breast exam.** / Every year after age 40.  Mammogram.** / Every year beginning at age 40   and continuing for as long as you are in good health. Consult with your  caregiver.  Pap test.** / Every 3 years starting at age 30 through age 65 or 70 with a history of 3 consecutive normal Pap tests.  HPV screening.** / Every 3 years from ages 30 through ages 65 to 70 with a history of 3 consecutive normal Pap tests.  Fecal occult blood test (FOBT) of stool. / Every year beginning at age 50 and continuing until age 75. You may not need to do this test if you get a colonoscopy every 10 years.  Flexible sigmoidoscopy or colonoscopy.** / Every 5 years for a flexible sigmoidoscopy or every 10 years for a colonoscopy beginning at age 50 and continuing until age 75.  Hepatitis C blood test.** / For all people born from 1945 through 1965 and any individual with known risks for hepatitis C.  Skin self-exam. / Monthly.  Influenza immunization.** / Every year.  Pneumococcal polysaccharide immunization.** / 1 to 2 doses if you smoke cigarettes or if you have certain chronic medical conditions.  Tetanus, diphtheria, pertussis (Tdap, Td) immunization.** / A one-time dose of Tdap vaccine. After that, you need a Td booster dose every 10 years.  Measles, mumps, rubella (MMR) immunization. / You need at least 1 dose of MMR if you were born in 1957 or later. You may also need a second dose.  Varicella immunization.** / Consult your caregiver.  Meningococcal immunization.** / Consult your caregiver.  Hepatitis A immunization.** / Consult your caregiver. 2 doses, 6 to 18 months apart.  Hepatitis B immunization.** / Consult your caregiver. 3 doses, usually over 6 months. Ages 65 and over  Blood pressure check.** / Every 1 to 2 years.  Lipid and cholesterol check.** / Every 5 years beginning at age 20.  Clinical breast exam.** / Every year after age 40.  Mammogram.** / Every year beginning at age 40 and continuing for as long as you are in good health. Consult with your caregiver.  Pap test.** / Every 3 years starting at age 30 through age 65 or 70 with a 3  consecutive normal Pap tests. Testing can be stopped between 65 and 70 with 3 consecutive normal Pap tests and no abnormal Pap or HPV tests in the past 10 years.  HPV screening.** / Every 3 years from ages 30 through ages 65 or 70 with a history of 3 consecutive normal Pap tests. Testing can be stopped between 65 and 70 with 3 consecutive normal Pap tests and no abnormal Pap or HPV tests in the past 10 years.  Fecal occult blood test (FOBT) of stool. / Every year beginning at age 50 and continuing until age 75. You may not need to do this test if you get a colonoscopy every 10 years.  Flexible sigmoidoscopy or colonoscopy.** / Every 5 years for a flexible sigmoidoscopy or every 10 years for a colonoscopy beginning at age 50 and continuing until age 75.  Hepatitis C blood test.** / For all people born from 1945 through 1965 and any individual with known risks for hepatitis C.  Osteoporosis screening.** / A one-time screening for women ages 65 and over and women at risk for fractures or osteoporosis.  Skin self-exam. / Monthly.  Influenza immunization.** / Every year.  Pneumococcal polysaccharide immunization.** / 1 dose at age 65 (or older) if you have never been vaccinated.  Tetanus, diphtheria, pertussis (Tdap, Td) immunization. / A one-time dose of Tdap vaccine if you are over   65 and have contact with an infant, are a healthcare worker, or simply want to be protected from whooping cough. After that, you need a Td booster dose every 10 years.  Varicella immunization.** / Consult your caregiver.  Meningococcal immunization.** / Consult your caregiver.  Hepatitis A immunization.** / Consult your caregiver. 2 doses, 6 to 18 months apart.  Hepatitis B immunization.** / Check with your caregiver. 3 doses, usually over 6 months. ** Family history and personal history of risk and conditions may change your caregiver's recommendations. Document Released: 03/13/2001 Document Revised: 04/09/2011  Document Reviewed: 06/12/2010 [employer] Patient Information 2014 [employer]  

## 2012-09-25 NOTE — Progress Notes (Signed)
  Subjective:    Patient ID: Wendy Hawkins, female    DOB: 1975/03/19, 37 y.o.   MRN: [id number]  HPI  She returns for a physical but she also requests that I refill her Lamictal - she has been seeing a Psy. For PTSD (she witnessed a cousin being murdered at 54 yoa) and has done very well on Lamitcal but she tells me that it is too expensive for her to continue seeing a Psy.    Review of Systems  Constitutional: Negative.   HENT: Negative.   Eyes: Negative.   Respiratory: Negative.   Cardiovascular: Negative.   Gastrointestinal: Negative.   Endocrine: Negative.   Genitourinary: Negative.   Musculoskeletal: Negative.   Skin: Negative.   Allergic/Immunologic: Negative.   Neurological: Negative.   Hematological: Negative.   Psychiatric/Behavioral: Negative.  Negative for suicidal ideas, hallucinations, behavioral problems, confusion, sleep disturbance, self-injury, decreased concentration and agitation. The patient is not nervous/anxious and is not hyperactive.        Objective:   Physical Exam  Vitals reviewed. Constitutional: She is oriented to person, place, and time. She appears well-developed and well-nourished. No distress.  HENT:  Head: Normocephalic and atraumatic.  Mouth/Throat: Oropharynx is clear and moist. No oropharyngeal exudate.  Eyes: Conjunctivae are normal. Right eye exhibits no discharge. Left eye exhibits no discharge. No scleral icterus.  Neck: Normal range of motion. Neck supple. No JVD present. No tracheal deviation present. No thyromegaly present.  Cardiovascular: Normal rate, regular rhythm, normal heart sounds and intact distal pulses.  Exam reveals no gallop and no friction rub.   No murmur heard. Pulmonary/Chest: Effort normal and breath sounds normal. No stridor. No respiratory distress. She has no wheezes. She has no rales. She exhibits no tenderness.  Abdominal: Soft. Bowel sounds are normal. She exhibits no distension and no mass. There is no  tenderness. There is no rebound and no guarding.  Musculoskeletal: She exhibits no edema and no tenderness.  Lymphadenopathy:    She has no cervical adenopathy.  Neurological: She is oriented to person, place, and time.  Skin: Skin is warm and dry. No rash noted. She is not diaphoretic. No erythema. No pallor.  Psychiatric: She has a normal mood and affect. Her speech is normal and behavior is normal. Judgment and thought content normal. Her mood appears not anxious. Her affect is not angry, not blunt and not inappropriate. Cognition and memory are normal. She does not exhibit a depressed mood.     Lab Results  Component Value Date   WBC 7.2 12/06/2011   HGB 14.2 12/06/2011   HCT 42.5 12/06/2011   PLT 203.0 12/06/2011   GLUCOSE 85 12/06/2011   CHOL 153 12/06/2011   TRIG 37.0 12/06/2011   HDL 44.90 12/06/2011   LDLCALC 101* 12/06/2011   ALT 13 12/06/2011   AST 14 12/06/2011   NA 138 12/06/2011   K 4.1 12/06/2011   CL 103 12/06/2011   CREATININE 0.8 12/06/2011   BUN 7 12/06/2011   CO2 27 12/06/2011   TSH 1.01 12/06/2011       Assessment & Plan:

## 2012-09-26 ENCOUNTER — Encounter: Payer: Self-pay | Admitting: Internal Medicine

## 2012-09-26 LAB — LIPID PANEL
HDL: 54.7 mg/dL (ref >39.00)
LDL Cholesterol: 101 mg/dL — ABNORMAL HIGH (ref 0–99)
Total CHOL/HDL Ratio: 3
Triglycerides: 48 mg/dL (ref 0.0–149.0)

## 2012-09-26 LAB — COMPREHENSIVE METABOLIC PANEL
ALT: 12 U/L (ref 0–35)
AST: 13 U/L (ref 0–37)
Creatinine, Ser: 0.7 mg/dL (ref 0.4–1.2)
GFR: 107.69 mL/min (ref >60.00)
Total Bilirubin: 2.8 mg/dL — ABNORMAL HIGH (ref 0.3–1.2)

## 2012-09-26 LAB — TSH: TSH: 1.34 u[IU]/mL (ref 0.35–5.50)

## 2012-09-26 NOTE — Assessment & Plan Note (Signed)
Exam done Vaccines were reviewed Labs ordered Pt ed material was given 

## 2012-09-26 NOTE — Assessment & Plan Note (Signed)
Continue lamictal 

## 2012-10-13 ENCOUNTER — Telehealth: Payer: Self-pay

## 2012-10-13 ENCOUNTER — Other Ambulatory Visit (INDEPENDENT_AMBULATORY_CARE_PROVIDER_SITE_OTHER): Payer: PRIVATE HEALTH INSURANCE

## 2012-10-13 ENCOUNTER — Ambulatory Visit (INDEPENDENT_AMBULATORY_CARE_PROVIDER_SITE_OTHER): Payer: PRIVATE HEALTH INSURANCE | Admitting: Internal Medicine

## 2012-10-13 VITALS — BP 122/76 | HR 76 | Temp 97.5°F | Resp 16

## 2012-10-13 DIAGNOSIS — R21 Rash and other nonspecific skin eruption: Secondary | ICD-10-CM

## 2012-10-13 LAB — CBC WITH DIFFERENTIAL/PLATELET
Basophils Relative: 0.5 % (ref 0.0–3.0)
Eosinophils Relative: 1.7 % (ref 0.0–5.0)
Lymphocytes Relative: 38.4 % (ref 12.0–46.0)
MCV: 90.1 fl (ref 78.0–100.0)
Monocytes Absolute: 0.3 10*3/uL (ref 0.1–1.0)
Neutrophils Relative %: 55.2 % (ref 43.0–77.0)
RBC: 4.35 Mil/uL (ref 3.87–5.11)
WBC: 7 10*3/uL (ref 4.5–10.5)

## 2012-10-13 LAB — RHEUMATOID FACTOR: Rhuematoid fact SerPl-aCnc: <10 IU/mL (ref <=14)

## 2012-10-13 LAB — C-REACTIVE PROTEIN: CRP: <0.5 mg/dL (ref 0.5–20.0)

## 2012-10-13 NOTE — Telephone Encounter (Signed)
Patient called [facility] c/o itchy leg and hand rash that . She scheduled appointment with dermatology for today at 3:30 but also has noticed fatigue, headache, dizziness, joint pain. She spoke with mother who advised history of leukemia and would like to know if she should be worried, and need to cancel derm appt and see PCP instead for possible lab testing. Thanks

## 2012-10-13 NOTE — Patient Instructions (Signed)

## 2012-10-13 NOTE — Telephone Encounter (Signed)
Pt scheduled appt to see MD today

## 2012-10-13 NOTE — Progress Notes (Signed)
Subjective:    Patient ID: Wendy Hawkins, female    DOB: Apr 26, 1975, 37 y.o.   MRN: [id number]  HPI  Over the last 5 days she has developed a rash, it starts off with itchy bumps then turns into "bruises". She does not have any new exposures or use of topicals. She has taken OTC doses of anti-histamines and the itching has resolved.  Review of Systems  Constitutional: Negative.  Negative for fever, chills, diaphoresis, appetite change and fatigue.  HENT: Negative.  Negative for congestion, sore throat, rhinorrhea, trouble swallowing, voice change and postnasal drip.   Eyes: Negative.   Respiratory: Negative.   Cardiovascular: Negative.  Negative for chest pain, palpitations and leg swelling.  Gastrointestinal: Negative.  Negative for nausea, vomiting, abdominal pain, diarrhea, constipation and blood in stool.  Endocrine: Negative.   Genitourinary: Negative.   Musculoskeletal: Negative.  Negative for myalgias, back pain, joint swelling, arthralgias and gait problem.  Skin: Positive for rash. Negative for pallor and wound.  Allergic/Immunologic: Negative.   Neurological: Negative.   Hematological: Negative.  Negative for adenopathy. Does not bruise/bleed easily.  Psychiatric/Behavioral: Negative.        Objective:   Physical Exam  Vitals reviewed. Constitutional: She is oriented to person, place, and time. She appears well-developed and well-nourished. No distress.  HENT:  Head: Normocephalic and atraumatic.  Mouth/Throat: Oropharynx is clear and moist. No oropharyngeal exudate.  Eyes: Conjunctivae are normal. Right eye exhibits no discharge. Left eye exhibits no discharge. No scleral icterus.  Neck: Normal range of motion. Neck supple. No JVD present. No tracheal deviation present. No thyromegaly present.  Cardiovascular: Normal rate, regular rhythm, normal heart sounds and intact distal pulses.  Exam reveals no gallop and no friction rub.   No murmur heard. Pulmonary/Chest:  Effort normal and breath sounds normal. No stridor. No respiratory distress. She has no wheezes. She has no rales. She exhibits no tenderness.  Abdominal: Soft. Bowel sounds are normal. She exhibits no distension and no mass. There is no tenderness. There is no rebound and no guarding.  Musculoskeletal: Normal range of motion. She exhibits no edema and no tenderness.  Lymphadenopathy:    She has no cervical adenopathy.  Neurological: She is oriented to person, place, and time.  Skin: Skin is warm, dry and intact. Ecchymosis and rash noted. No abrasion, no bruising, no burn, no laceration, no lesion, no petechiae and no purpura noted. Rash is papular. Rash is not macular, not maculopapular, not nodular, not pustular, not vesicular and not urticarial. She is not diaphoretic. No erythema. No pallor.     Left high ecchymosis was biopsied to look for vasculitis. The area was cleaned with betadine then prepped and draped in sterile fashion. Local anesthesia was obtained with 2 % lido with epi. Then a 6 mm punch incision was made and the specimen was collected and sent for pathology. 1 simple suture was placed to close the wound. She tolerated this well. Antibiotic ointment and a dressing were applied.  Psychiatric: She has a normal mood and affect. Her behavior is normal. Judgment and thought content normal.     Lab Results  Component Value Date   WBC 8.1 09/25/2012   HGB 14.2 09/25/2012   HCT 41.0 09/25/2012   PLT 228.0 09/25/2012   GLUCOSE 92 09/25/2012   CHOL 165 09/25/2012   TRIG 48.0 09/25/2012   HDL 54.70 09/25/2012   LDLCALC 101* 09/25/2012   ALT 12 09/25/2012   AST 13 09/25/2012   NA 137  09/25/2012   K 4.2 09/25/2012   CL 103 09/25/2012   CREATININE 0.7 09/25/2012   BUN 10 09/25/2012   CO2 27 09/25/2012   TSH 1.34 09/25/2012       Assessment & Plan:

## 2012-10-14 ENCOUNTER — Encounter: Payer: Self-pay | Admitting: Internal Medicine

## 2012-10-14 DIAGNOSIS — L300 Nummular dermatitis: Secondary | ICD-10-CM | POA: Insufficient documentation

## 2012-10-14 LAB — ANA: Anti Nuclear Antibody(ANA): NEGATIVE

## 2012-10-14 NOTE — Assessment & Plan Note (Signed)
This is suspicious for vasculitis. I will check her labs to look for inflammation and connective disease. She does not want anything for symptom relief. Biopsy sent. She will RTC in 1 week for suture removal.

## 2012-10-20 ENCOUNTER — Ambulatory Visit (INDEPENDENT_AMBULATORY_CARE_PROVIDER_SITE_OTHER): Payer: PRIVATE HEALTH INSURANCE | Admitting: Internal Medicine

## 2012-10-20 VITALS — BP 128/72 | HR 76 | Temp 97.3°F | Resp 16

## 2012-10-20 DIAGNOSIS — L259 Unspecified contact dermatitis, unspecified cause: Secondary | ICD-10-CM

## 2012-10-20 DIAGNOSIS — L300 Nummular dermatitis: Secondary | ICD-10-CM

## 2012-10-21 ENCOUNTER — Encounter: Payer: Self-pay | Admitting: Internal Medicine

## 2012-10-21 MED ORDER — CETIRIZINE HCL 10 MG PO TABS: 10.0000 mg | ORAL_TABLET | Freq: Every day | ORAL | Status: DC

## 2012-10-21 MED ORDER — TRIAMCINOLONE ACETONIDE 0.5 % EX CREA: TOPICAL_CREAM | Freq: Three times a day (TID) | CUTANEOUS | Status: DC

## 2012-10-21 NOTE — Progress Notes (Signed)
  Subjective:    Patient ID: Wendy Hawkins, female    DOB: 1975-03-24, 37 y.o.   MRN: [id number]  Rash This is a recurrent problem. The current episode started 1 to 4 weeks ago. The problem is unchanged. The affected locations include the left upper leg, left lower leg, right upper leg and right lower leg. The rash is characterized by itchiness. Pertinent negatives include no anorexia, congestion, cough, diarrhea, eye pain, facial edema, fatigue, fever, joint pain, nail changes, rhinorrhea, shortness of breath, sore throat or vomiting. Past treatments include topical steroids. The treatment provided mild relief.      Review of Systems  Constitutional: Negative for fever and fatigue.  HENT: Negative for congestion, sore throat and rhinorrhea.   Eyes: Negative for pain.  Respiratory: Negative for cough and shortness of breath.   Gastrointestinal: Negative for vomiting, diarrhea and anorexia.  Musculoskeletal: Negative for joint pain.  Skin: Positive for rash. Negative for nail changes.  All other systems reviewed and are negative.       Objective:   Physical Exam  Vitals reviewed. Constitutional: She is oriented to person, place, and time. She appears well-developed and well-nourished. No distress.  HENT:  Head: Normocephalic and atraumatic.  Mouth/Throat: No oropharyngeal exudate.  Eyes: Conjunctivae are normal. Right eye exhibits no discharge. Left eye exhibits no discharge. No scleral icterus.  Neck: Normal range of motion. Neck supple. No JVD present. No tracheal deviation present. No thyromegaly present.  Cardiovascular: Normal rate, regular rhythm, normal heart sounds and intact distal pulses.  Exam reveals no gallop and no friction rub.   No murmur heard. Pulmonary/Chest: Effort normal and breath sounds normal. No stridor. No respiratory distress. She has no wheezes. She has no rales. She exhibits no tenderness.  Abdominal: Soft. Bowel sounds are normal. She exhibits no  distension and no mass. There is no tenderness. There is no rebound and no guarding.  Musculoskeletal: Normal range of motion. She exhibits no edema and no tenderness.  Lymphadenopathy:    She has no cervical adenopathy.  Neurological: She is oriented to person, place, and time.  Skin: Skin is warm and dry. Abrasion and lesion noted. No bruising, no burn, no ecchymosis, no laceration, no petechiae and no rash noted. She is not diaphoretic. No erythema. No pallor.     Over both LE's there are faint streaks of excoriations but the ecchymoses have resolved.  Psychiatric: She has a normal mood and affect. Her behavior is normal. Judgment and thought content normal.          Assessment & Plan:

## 2012-10-21 NOTE — Assessment & Plan Note (Signed)
Biopsy shows sparse perivascular dermatitis Will treat with TAC cream and antihistamines

## 2012-12-04 ENCOUNTER — Other Ambulatory Visit: Payer: Self-pay

## 2012-12-12 ENCOUNTER — Telehealth: Payer: Self-pay

## 2012-12-12 DIAGNOSIS — R51 Headache: Secondary | ICD-10-CM

## 2012-12-12 NOTE — Telephone Encounter (Signed)
done

## 2012-12-12 NOTE — Telephone Encounter (Signed)
Patient called [facility] c/o possible pinched nerve, neck pain, and headache. She would like to request a referral to Neurology. Thanks

## 2012-12-15 NOTE — Telephone Encounter (Signed)
Pt.notified

## 2012-12-16 ENCOUNTER — Ambulatory Visit (INDEPENDENT_AMBULATORY_CARE_PROVIDER_SITE_OTHER): Payer: PRIVATE HEALTH INSURANCE | Admitting: Neurology

## 2012-12-16 ENCOUNTER — Encounter: Payer: Self-pay | Admitting: Neurology

## 2012-12-16 VITALS — BP 110/68 | HR 80 | Temp 97.7°F | Ht 65.0 in | Wt 151.0 lb

## 2012-12-16 DIAGNOSIS — M542 Cervicalgia: Secondary | ICD-10-CM

## 2012-12-16 DIAGNOSIS — R51 Headache: Secondary | ICD-10-CM

## 2012-12-16 MED ORDER — CYCLOBENZAPRINE HCL 5 MG PO TABS: 5.0000 mg | ORAL_TABLET | Freq: Every day | ORAL | Status: DC

## 2012-12-16 NOTE — Patient Instructions (Addendum)
I think your headaches are triggered by your neck pain.  1.  I would like to refer you to physical therapy to work on your neck.  2.  I will provide you with a prescription for Flexeril 5mg .  Take 1 tablet at bedtime if needed.  Caution as it causes drowsiness. 3.  If physical therapy ineffective, then I would likely start you on a preventative medication such as amitriptyline. 4.  Limit use of pain medications (ibuprofen, etc) to no more than 2 days out of week to prevent medication-overuse headaches. 5.  Follow up in 2 months.  Call with questions or concerns.  I will refer you to physical therapy. They will call you to schedule.

## 2012-12-16 NOTE — Progress Notes (Signed)
NEUROLOGY CONSULTATION NOTE  Wendy Hawkins MRN: [id number] DOB: 1975-08-05  Referring provider: Dr. Yetta Barre Primary care provider: Dr. Yetta Barre  Reason for consult:  Headache and neck pain.  HISTORY OF PRESENT ILLNESS: Wendy Hawkins is a 37 year old right-handed woman with PTSD and chronic neck pain who presents for neck pain and headache.  Records and images were personally reviewed where available.   Symptoms started about 3 weeks ago.  She describes a severe pain, 8/10, midline at the base of her skull.  She describes it as a pulsating pain as well as like a fist digging into her neck.  It is constant and occurs all day, with fluctuation of intensity.  When she lays down, she feels an uncomfortable pressure on the back of her head.  She has exacerbation of neck pain.  Her neck feels tight, particularly on the left side.  There is no nausea, photophobia, phonophobia, osmophobia or visual disturbance.  More recently, she has noted transient tingling on her nose, cheeks and finger tips.  It usually occurs with the headache, but not always.  It is not precipitated by particular position.  It just occurs spontaneously.  She reports pain in her forearms several months ago, but no pain in the wrists at this time.  When she turns her head to the right, she feels a tightness down the left side of her neck to the shoulder.  She denies pain radiating down the arms.  She has tried Excedrin and ibuprofen, with no relief.  She takes these medications around the clock daily.  She has no history of headache.  She has tried both PT and Flexeril in the past, but not currently for this.    MRI of cervical spine performed on 04/06/10 revealed mild disc bulging at C5-6 and C6-7, with effacement of the ventral thecal sac, but no central or foraminal stenosis.  PAST MEDICAL HISTORY: Past Medical History  Diagnosis Date  . GERD (gastroesophageal reflux disease)   . Anxiety   . Depression     Dr. Marisue Brooklyn     PAST SURGICAL HISTORY: Past Surgical History  Procedure Laterality Date  . Tonsillectomy and adenoidectomy    . Wisdom tooth extraction      MEDICATIONS: Current Outpatient Prescriptions on File Prior to Visit  Medication Sig Dispense Refill  . ALPRAZolam (NIRAVAM) 0.5 MG dissolvable tablet Take 1 tablet (0.5 mg total) by mouth 2 (two) times daily as needed for anxiety.  20 tablet  0  . amphetamine-dextroamphetamine (ADDERALL XR) 20 MG 24 hr capsule Take 1 capsule (20 mg total) by mouth every morning.  30 capsule  0  . cetirizine (ZYRTEC) 10 MG tablet Take 1 tablet (10 mg total) by mouth daily.  90 tablet  3  . lamoTRIgine (LAMICTAL) 100 MG tablet Take 1 tablet (100 mg total) by mouth daily.  90 tablet  3  . levonorgestrel (MIRENA) 20 MCG/24HR IUD 1 Intra Uterine Device (1 each total) by Intrauterine route once.  1 each  0  . triamcinolone cream (KENALOG) 0.5 % Apply topically 3 (three) times daily.  30 g  3   No current facility-administered medications on file prior to visit.    ALLERGIES: Allergies  Allergen Reactions  . Codeine   . Depo-Medrol [Methylprednisolone Acetate]     FAMILY HISTORY: Family History  Problem Relation Age of Onset  . Lung cancer Father   . Hypertension Father   . Heart disease Father   . Alcohol  abuse Neg Hx   . Diabetes Neg Hx   . Drug abuse Neg Hx   . Early death Neg Hx   . Hyperlipidemia Neg Hx   . Kidney disease Neg Hx   . Stroke Neg Hx     SOCIAL HISTORY: History   Social History  . Marital Status: Married    Spouse Name: N/A    Number of Children: 3  . Years of Education: N/A   Occupational History  . SCHEDULER     [employer]   Social History Main Topics  . Smoking status: Former Smoker -- 1.00 packs/day for 20 years    Types: Cigarettes    Quit date: 06/04/2012  . Smokeless tobacco: Never Used     Comment: e cigs periodically  . Alcohol Use: 1.2 oz/week    2 Shots of liquor per week     Comment: socially  .  Drug Use: No  . Sexual Activity: Yes    Birth Control/ Protection: IUD   Other Topics Concern  . Not on file   Social History Narrative  . No narrative on file    REVIEW OF SYSTEMS: Constitutional: No fevers, chills, or sweats, no generalized fatigue, change in appetite Eyes: No visual changes, double vision, eye pain Ear, nose and throat: No hearing loss, ear pain, nasal congestion, sore throat Cardiovascular: No chest pain, palpitations Respiratory:  No shortness of breath at rest or with exertion, wheezes GastrointestinaI: No nausea, vomiting, diarrhea, abdominal pain, fecal incontinence Genitourinary:  No dysuria, urinary retention or frequency Musculoskeletal:  No neck pain, back pain Integumentary: No rash, pruritus, skin lesions Neurological: as above Psychiatric: No depression, insomnia, anxiety Endocrine: No palpitations, fatigue, diaphoresis, mood swings, change in appetite, change in weight, increased thirst Hematologic/Lymphatic:  No anemia, purpura, petechiae. Allergic/Immunologic: no itchy/runny eyes, nasal congestion, recent allergic reactions, rashes  PHYSICAL EXAM: Filed Vitals:   12/16/12 0833  BP: 110/68  Pulse: 80  Temp: 97.7 F (36.5 C)   General: No acute distress Head:  Normocephalic/atraumatic, no tenderness to palpation of the occipital notches. Neck: supple, full range of motion, severe tenderness to palpation at midline at back of upper neck. Back: No paraspinal tenderness Heart: regular rate and rhythm Lungs: Clear to auscultation bilaterally. Vascular: No carotid bruits. Neurological Exam: Mental status: alert and oriented to person, place, and time, speech fluent and not dysarthric, language intact. Cranial nerves: CN I: not tested CN II: pupils equal, round and reactive to light, visual fields intact, fundi unremarkable. CN III, IV, VI:  full range of motion, no nystagmus, no ptosis CN V: facial sensation intact CN VII: upper and lower  face symmetric CN VIII: hearing intact CN IX, X: gag intact, uvula midline CN XI: sternocleidomastoid and trapezius muscles intact CN XII: tongue midline Bulk & Tone: normal, no fasciculations. Motor: 5/5 throughout Sensation: temperature and vibration intact. Deep Tendon Reflexes: 2+ throughout, toes down Finger to nose testing: no dysmetria Heel to shin: no dysmetria Gait: normal stance and stride.  Able to walk on toes, heels and in tandem. Romberg negative. Tinel's sign negative.  IMPRESSION: 1.  Cervicogenic headache.  It does not seem migrainous and there is no occipital notch tenderness to suggest occipital neuralgia.  I am not sure what to make of the transient tingling in the face.  Since it is transient and she has a non-focal normal exam, I don't suspect anything in the brain.  Possibly a manifestation of the headache. 2. Transient tingling in fingertips.  May be early carpal tunnel syndrome or radiculopathy but symptoms are vague and not specific to localize at this time.    PLAN: 1.  PT of the neck. 2.  Will provide 5mg  Flexeril to take at bedtime, when it is severe. 3.  Instructed about medication-overuse headache. 4.  If PT ineffective, may need to consider starting a medication such as amitriptyline or nortriptyline. 5.  Continue to monitor finger tingling for now. 6.  Follow up in 2 months.  Call with questions or concerns.  Thank you for allowing me to take part in the care of this patient.  Shon Millet, DO  CC: Sanda Linger, MD

## 2012-12-31 ENCOUNTER — Ambulatory Visit: Payer: PRIVATE HEALTH INSURANCE | Attending: Neurology | Admitting: Physical Therapy

## 2012-12-31 DIAGNOSIS — R51 Headache: Secondary | ICD-10-CM | POA: Insufficient documentation

## 2012-12-31 DIAGNOSIS — M542 Cervicalgia: Secondary | ICD-10-CM | POA: Insufficient documentation

## 2012-12-31 DIAGNOSIS — R293 Abnormal posture: Secondary | ICD-10-CM | POA: Insufficient documentation

## 2012-12-31 DIAGNOSIS — M2560 Stiffness of unspecified joint, not elsewhere classified: Secondary | ICD-10-CM | POA: Insufficient documentation

## 2012-12-31 DIAGNOSIS — M25519 Pain in unspecified shoulder: Secondary | ICD-10-CM | POA: Insufficient documentation

## 2012-12-31 DIAGNOSIS — M25619 Stiffness of unspecified shoulder, not elsewhere classified: Secondary | ICD-10-CM | POA: Insufficient documentation

## 2012-12-31 DIAGNOSIS — IMO0001 Reserved for inherently not codable concepts without codable children: Secondary | ICD-10-CM | POA: Insufficient documentation

## 2013-01-13 ENCOUNTER — Ambulatory Visit: Payer: PRIVATE HEALTH INSURANCE | Admitting: Neurology

## 2013-01-14 ENCOUNTER — Encounter: Payer: PRIVATE HEALTH INSURANCE | Admitting: Physical Therapy

## 2013-01-30 ENCOUNTER — Encounter: Payer: Self-pay | Admitting: Internal Medicine

## 2013-01-30 ENCOUNTER — Ambulatory Visit (INDEPENDENT_AMBULATORY_CARE_PROVIDER_SITE_OTHER): Payer: PRIVATE HEALTH INSURANCE | Admitting: Internal Medicine

## 2013-01-30 VITALS — BP 120/70 | HR 80 | Temp 97.2°F | Resp 16 | Wt 155.0 lb

## 2013-01-30 DIAGNOSIS — J019 Acute sinusitis, unspecified: Secondary | ICD-10-CM | POA: Insufficient documentation

## 2013-01-30 MED ORDER — HYDROCOD POLST-CHLORPHEN POLST 10-8 MG/5ML PO LQCR: 5.0000 mL | Freq: Two times a day (BID) | ORAL | Status: DC | PRN

## 2013-01-30 MED ORDER — AMOXICILLIN 875 MG PO TABS: 875.0000 mg | ORAL_TABLET | Freq: Two times a day (BID) | ORAL | Status: DC

## 2013-01-30 NOTE — Patient Instructions (Signed)

## 2013-01-30 NOTE — Progress Notes (Signed)
Subjective:    Patient ID: Wendy Hawkins, female    DOB: October 09, 1975, 38 y.o.   MRN: [id number]  Sinusitis This is a new problem. The current episode started 1 to 4 weeks ago. The problem has been gradually worsening since onset. There has been no fever. Her pain is at a severity of 0/10. She is experiencing no pain. Associated symptoms include chills, congestion, coughing and sinus pressure. Pertinent negatives include no diaphoresis, ear pain, headaches, hoarse voice, neck pain, shortness of breath, sneezing, sore throat or swollen glands. Past treatments include oral decongestants. The treatment provided mild relief.      Review of Systems  Constitutional: Positive for chills. Negative for fever, diaphoresis, activity change, appetite change, fatigue and unexpected weight change.  HENT: Positive for congestion, postnasal drip, rhinorrhea and sinus pressure. Negative for ear pain, hoarse voice, nosebleeds, sneezing, sore throat, tinnitus, trouble swallowing and voice change.   Eyes: Negative.   Respiratory: Positive for cough. Negative for apnea, choking, chest tightness, shortness of breath, wheezing and stridor.   Cardiovascular: Negative.  Negative for chest pain, palpitations and leg swelling.  Gastrointestinal: Negative.  Negative for abdominal pain.  Endocrine: Negative.   Genitourinary: Negative.   Musculoskeletal: Negative.  Negative for neck pain.  Allergic/Immunologic: Negative.   Neurological: Negative.  Negative for dizziness, tremors, syncope, weakness, light-headedness and headaches.  Hematological: Negative.  Negative for adenopathy. Does not bruise/bleed easily.  Psychiatric/Behavioral: Negative.        Objective:   Physical Exam  Vitals reviewed. Constitutional: She is oriented to person, place, and time. She appears well-developed and well-nourished.  Non-toxic appearance. She does not have a sickly appearance. She does not appear ill. No distress.  HENT:  Head:  Atraumatic.  Right Ear: Hearing, tympanic membrane, external ear and ear canal normal.  Left Ear: Hearing, tympanic membrane, external ear and ear canal normal.  Nose: No mucosal edema or rhinorrhea. Right sinus exhibits maxillary sinus tenderness. Right sinus exhibits no frontal sinus tenderness. Left sinus exhibits maxillary sinus tenderness. Left sinus exhibits no frontal sinus tenderness.  Mouth/Throat: Oropharynx is clear and moist and mucous membranes are normal. Mucous membranes are not pale, not dry and not cyanotic. No oral lesions. No trismus in the jaw. No oropharyngeal exudate, posterior oropharyngeal edema, posterior oropharyngeal erythema or tonsillar abscesses.  Eyes: Conjunctivae are normal. Right eye exhibits no discharge. Left eye exhibits no discharge. No scleral icterus.  Neck: Normal range of motion. Neck supple. No JVD present. No tracheal deviation present. No thyromegaly present.  Cardiovascular: Normal rate, regular rhythm, normal heart sounds and intact distal pulses.  Exam reveals no gallop and no friction rub.   No murmur heard. Pulmonary/Chest: Effort normal and breath sounds normal. No stridor. No respiratory distress. She has no wheezes. She has no rales. She exhibits no tenderness.  Abdominal: Soft. Bowel sounds are normal. She exhibits no distension and no mass. There is no tenderness. There is no rebound and no guarding.  Musculoskeletal: Normal range of motion. She exhibits no edema and no tenderness.  Lymphadenopathy:    She has no cervical adenopathy.  Neurological: She is oriented to person, place, and time.  Skin: Skin is warm and dry. No rash noted. She is not diaphoretic. No erythema. No pallor.  Psychiatric: She has a normal mood and affect. Her behavior is normal. Judgment and thought content normal.     Lab Results  Component Value Date   WBC 7.0 10/13/2012   HGB 13.4 10/13/2012  HCT 39.2 10/13/2012   PLT 195.0 10/13/2012   GLUCOSE 92 09/25/2012    CHOL 165 09/25/2012   TRIG 48.0 09/25/2012   HDL 54.70 09/25/2012   LDLCALC 101* 09/25/2012   ALT 12 09/25/2012   AST 13 09/25/2012   NA 137 09/25/2012   K 4.2 09/25/2012   CL 103 09/25/2012   CREATININE 0.7 09/25/2012   BUN 10 09/25/2012   CO2 27 09/25/2012   TSH 1.34 09/25/2012       Assessment & Plan:

## 2013-01-30 NOTE — Progress Notes (Signed)
Pre visit review using our clinic review tool, if applicable. No additional management support is needed unless otherwise documented below in the visit note. 

## 2013-01-30 NOTE — Assessment & Plan Note (Signed)
I will treat the infection with amoxil and will control the symptoms with tussionex susp

## 2013-02-02 ENCOUNTER — Other Ambulatory Visit: Payer: Self-pay | Admitting: Internal Medicine

## 2013-02-03 MED ORDER — ALPRAZOLAM 0.5 MG PO TBDP: 0.5000 mg | ORAL_TABLET | Freq: Two times a day (BID) | ORAL | Status: DC | PRN

## 2013-02-17 ENCOUNTER — Ambulatory Visit: Payer: PRIVATE HEALTH INSURANCE | Admitting: Neurology

## 2013-02-23 ENCOUNTER — Other Ambulatory Visit: Payer: Self-pay | Admitting: Internal Medicine

## 2013-02-25 ENCOUNTER — Other Ambulatory Visit: Payer: Self-pay | Admitting: Internal Medicine

## 2013-02-25 MED ORDER — ALPRAZOLAM 0.5 MG PO TBDP: 0.5000 mg | ORAL_TABLET | Freq: Two times a day (BID) | ORAL | Status: DC | PRN

## 2013-03-23 ENCOUNTER — Other Ambulatory Visit: Payer: Self-pay | Admitting: Internal Medicine

## 2013-04-30 ENCOUNTER — Other Ambulatory Visit: Payer: Self-pay

## 2013-04-30 DIAGNOSIS — Z1231 Encounter for screening mammogram for malignant neoplasm of breast: Secondary | ICD-10-CM

## 2013-05-15 ENCOUNTER — Ambulatory Visit
Admission: RE | Admit: 2013-05-15 | Discharge: 2013-05-15 | Disposition: A | Discharge status: Home / Self Care | Payer: PRIVATE HEALTH INSURANCE | Source: Ambulatory Visit

## 2013-05-15 DIAGNOSIS — Z1231 Encounter for screening mammogram for malignant neoplasm of breast: Secondary | ICD-10-CM

## 2013-05-19 ENCOUNTER — Other Ambulatory Visit: Payer: Self-pay | Admitting: Obstetrics and Gynecology

## 2013-05-19 DIAGNOSIS — R928 Other abnormal and inconclusive findings on diagnostic imaging of breast: Secondary | ICD-10-CM

## 2013-05-20 ENCOUNTER — Ambulatory Visit
Admission: RE | Admit: 2013-05-20 | Discharge: 2013-05-20 | Disposition: A | Discharge status: Home / Self Care | Payer: PRIVATE HEALTH INSURANCE | Source: Ambulatory Visit | Attending: Obstetrics and Gynecology | Admitting: Obstetrics and Gynecology

## 2013-05-20 DIAGNOSIS — R928 Other abnormal and inconclusive findings on diagnostic imaging of breast: Secondary | ICD-10-CM

## 2013-05-25 ENCOUNTER — Other Ambulatory Visit: Payer: PRIVATE HEALTH INSURANCE

## 2013-07-23 ENCOUNTER — Other Ambulatory Visit: Payer: Self-pay | Admitting: Internal Medicine

## 2013-08-11 ENCOUNTER — Encounter: Payer: Self-pay | Admitting: Internal Medicine

## 2013-08-12 ENCOUNTER — Other Ambulatory Visit: Payer: Self-pay | Admitting: Internal Medicine

## 2013-08-20 ENCOUNTER — Other Ambulatory Visit: Payer: Self-pay | Admitting: Internal Medicine

## 2013-08-20 NOTE — Telephone Encounter (Signed)
Need appt

## 2013-08-27 ENCOUNTER — Encounter: Payer: Self-pay | Admitting: Internal Medicine

## 2013-08-27 ENCOUNTER — Ambulatory Visit (INDEPENDENT_AMBULATORY_CARE_PROVIDER_SITE_OTHER): Payer: PRIVATE HEALTH INSURANCE | Admitting: Internal Medicine

## 2013-08-27 VITALS — BP 120/70 | HR 76 | Temp 97.6°F | Resp 16 | Ht 65.0 in | Wt 148.8 lb

## 2013-08-27 DIAGNOSIS — F319 Bipolar disorder, unspecified: Secondary | ICD-10-CM | POA: Insufficient documentation

## 2013-08-27 DIAGNOSIS — F411 Generalized anxiety disorder: Secondary | ICD-10-CM

## 2013-08-27 DIAGNOSIS — F4310 Post-traumatic stress disorder, unspecified: Secondary | ICD-10-CM

## 2013-08-27 DIAGNOSIS — F900 Attention-deficit hyperactivity disorder, predominantly inattentive type: Secondary | ICD-10-CM | POA: Insufficient documentation

## 2013-08-27 DIAGNOSIS — F909 Attention-deficit hyperactivity disorder, unspecified type: Secondary | ICD-10-CM | POA: Insufficient documentation

## 2013-08-27 DIAGNOSIS — F902 Attention-deficit hyperactivity disorder, combined type: Secondary | ICD-10-CM

## 2013-08-27 MED ORDER — AMPHETAMINE-DEXTROAMPHETAMINE 20 MG PO TABS: 20.0000 mg | ORAL_TABLET | Freq: Two times a day (BID) | ORAL | Status: DC

## 2013-08-27 MED ORDER — ALPRAZOLAM 0.5 MG PO TABS: 0.5000 mg | ORAL_TABLET | Freq: Every evening | ORAL | Status: DC | PRN

## 2013-08-27 MED ORDER — LAMOTRIGINE 100 MG PO TABS: 100.0000 mg | ORAL_TABLET | Freq: Every day | ORAL | Status: DC

## 2013-08-27 NOTE — Patient Instructions (Signed)

## 2013-08-27 NOTE — Progress Notes (Signed)
Subjective:    Patient ID: Wendy Hawkins, female    DOB: Nov 02, 1975, 38 y.o.   MRN: [id number]  HPI Comments: She returns for follow up and she asks that I prescribe her psych meds as she feels like her current situation with psychiatry and psychology is too expensive. She has been doing really well on these meds for several years.     Review of Systems  Constitutional: Negative.  Negative for fever, chills, diaphoresis, activity change, appetite change, fatigue and unexpected weight change.  HENT: Negative.  Negative for trouble swallowing.   Eyes: Negative.   Respiratory: Negative.  Negative for cough, choking, chest tightness, shortness of breath, wheezing and stridor.   Cardiovascular: Negative.  Negative for chest pain, palpitations and leg swelling.  Gastrointestinal: Negative.  Negative for nausea, vomiting, abdominal pain, diarrhea, constipation and blood in stool.  Endocrine: Negative.   Genitourinary: Negative.   Skin: Negative.   Allergic/Immunologic: Negative.   Neurological: Negative.   Hematological: Negative.  Negative for adenopathy. Does not bruise/bleed easily.  Psychiatric/Behavioral: Positive for sleep disturbance, dysphoric mood and decreased concentration. Negative for suicidal ideas, hallucinations, behavioral problems, confusion, self-injury and agitation. The patient is nervous/anxious. The patient is not hyperactive.        Objective:   Physical Exam  Vitals reviewed. Constitutional: She is oriented to person, place, and time. She appears well-developed and well-nourished. No distress.  HENT:  Head: Normocephalic and atraumatic.  Mouth/Throat: Oropharynx is clear and moist. No oropharyngeal exudate.  Eyes: Conjunctivae are normal. Right eye exhibits no discharge. Left eye exhibits no discharge. No scleral icterus.  Neck: Normal range of motion. Neck supple. No JVD present. No tracheal deviation present. No thyromegaly present.  Cardiovascular: Normal  rate, regular rhythm, normal heart sounds and intact distal pulses.  Exam reveals no gallop and no friction rub.   No murmur heard. Pulmonary/Chest: Effort normal and breath sounds normal. No stridor. No respiratory distress. She has no wheezes. She has no rales. She exhibits no tenderness.  Abdominal: Soft. Bowel sounds are normal. She exhibits no distension and no mass. There is no tenderness. There is no rebound and no guarding.  Musculoskeletal: Normal range of motion. She exhibits no edema and no tenderness.  Lymphadenopathy:    She has no cervical adenopathy.  Neurological: She is oriented to person, place, and time.  Skin: Skin is warm and dry. No rash noted. She is not diaphoretic. No erythema. No pallor.  Psychiatric: Her behavior is normal. Judgment and thought content normal. Her mood appears anxious. Her affect is not angry, not blunt, not labile and not inappropriate. Her speech is not delayed, not tangential and not slurred. Cognition and memory are normal. She does not exhibit a depressed mood. She expresses no homicidal and no suicidal ideation. She expresses no suicidal plans and no homicidal plans. She is communicative.     Lab Results  Component Value Date   WBC 7.0 10/13/2012   HGB 13.4 10/13/2012   HCT 39.2 10/13/2012   PLT 195.0 10/13/2012   GLUCOSE 92 09/25/2012   CHOL 165 09/25/2012   TRIG 48.0 09/25/2012   HDL 54.70 09/25/2012   LDLCALC 101* 09/25/2012   ALT 12 09/25/2012   AST 13 09/25/2012   NA 137 09/25/2012   K 4.2 09/25/2012   CL 103 09/25/2012   CREATININE 0.7 09/25/2012   BUN 10 09/25/2012   CO2 27 09/25/2012   TSH 1.34 09/25/2012       Assessment & Plan:

## 2013-08-27 NOTE — Progress Notes (Signed)
Pre visit review using our clinic review tool, if applicable. No additional management support is needed unless otherwise documented below in the visit note. 

## 2013-08-28 NOTE — Assessment & Plan Note (Signed)
She tells me that Vyvanse was too expensive Will try generic adderall

## 2013-08-28 NOTE — Assessment & Plan Note (Signed)
Will cont the current meds

## 2013-08-28 NOTE — Assessment & Plan Note (Signed)
She will cont the current meds

## 2013-10-13 ENCOUNTER — Other Ambulatory Visit: Payer: Self-pay

## 2013-10-13 ENCOUNTER — Other Ambulatory Visit: Payer: Self-pay | Admitting: Obstetrics and Gynecology

## 2013-10-13 DIAGNOSIS — N6489 Other specified disorders of breast: Secondary | ICD-10-CM

## 2013-10-29 ENCOUNTER — Ambulatory Visit
Admission: RE | Admit: 2013-10-29 | Discharge: 2013-10-29 | Disposition: A | Discharge status: Home / Self Care | Payer: PRIVATE HEALTH INSURANCE | Source: Ambulatory Visit | Attending: Obstetrics and Gynecology | Admitting: Obstetrics and Gynecology

## 2013-10-29 ENCOUNTER — Other Ambulatory Visit: Payer: Self-pay | Admitting: Obstetrics and Gynecology

## 2013-10-29 DIAGNOSIS — N6489 Other specified disorders of breast: Secondary | ICD-10-CM

## 2013-10-30 LAB — CYTOLOGY - PAP

## 2013-11-02 ENCOUNTER — Encounter: Payer: Self-pay | Admitting: Gastroenterology

## 2013-12-30 ENCOUNTER — Telehealth: Payer: Self-pay | Admitting: Gastroenterology

## 2013-12-30 ENCOUNTER — Ambulatory Visit: Payer: PRIVATE HEALTH INSURANCE | Admitting: Gastroenterology

## 2013-12-30 NOTE — Telephone Encounter (Signed)
No charge. 

## 2014-01-12 ENCOUNTER — Other Ambulatory Visit: Payer: Self-pay | Admitting: Internal Medicine

## 2014-01-13 ENCOUNTER — Other Ambulatory Visit: Payer: Self-pay | Admitting: Internal Medicine

## 2014-01-13 DIAGNOSIS — F900 Attention-deficit hyperactivity disorder, predominantly inattentive type: Secondary | ICD-10-CM

## 2014-01-13 MED ORDER — AMPHETAMINE-DEXTROAMPHET ER 30 MG PO CP24: 30.0000 mg | ORAL_CAPSULE | Freq: Every day | ORAL | Status: DC

## 2014-01-14 ENCOUNTER — Ambulatory Visit: Payer: Self-pay | Admitting: Internal Medicine

## 2014-02-03 ENCOUNTER — Other Ambulatory Visit: Payer: Self-pay | Admitting: Internal Medicine

## 2014-02-03 ENCOUNTER — Encounter: Payer: Self-pay | Admitting: Internal Medicine

## 2014-02-03 DIAGNOSIS — F902 Attention-deficit hyperactivity disorder, combined type: Secondary | ICD-10-CM

## 2014-02-03 MED ORDER — AMPHETAMINE-DEXTROAMPHETAMINE 20 MG PO TABS: 20.0000 mg | ORAL_TABLET | Freq: Two times a day (BID) | ORAL | Status: DC

## 2014-03-01 ENCOUNTER — Other Ambulatory Visit: Payer: Self-pay | Admitting: Internal Medicine

## 2014-04-19 ENCOUNTER — Other Ambulatory Visit: Payer: Self-pay

## 2014-04-19 DIAGNOSIS — Z9289 Personal history of other medical treatment: Secondary | ICD-10-CM

## 2014-04-29 ENCOUNTER — Other Ambulatory Visit: Payer: Self-pay | Admitting: Internal Medicine

## 2014-05-12 ENCOUNTER — Other Ambulatory Visit: Payer: Self-pay | Admitting: Internal Medicine

## 2014-05-12 DIAGNOSIS — F902 Attention-deficit hyperactivity disorder, combined type: Secondary | ICD-10-CM

## 2014-05-12 MED ORDER — AMPHETAMINE-DEXTROAMPHETAMINE 20 MG PO TABS: 20.0000 mg | ORAL_TABLET | Freq: Two times a day (BID) | ORAL | Status: DC

## 2014-05-24 ENCOUNTER — Ambulatory Visit
Admission: RE | Admit: 2014-05-24 | Discharge: 2014-05-24 | Disposition: A | Discharge status: Home / Self Care | Payer: 59 | Source: Ambulatory Visit

## 2014-05-24 DIAGNOSIS — Z9289 Personal history of other medical treatment: Secondary | ICD-10-CM

## 2014-06-08 ENCOUNTER — Telehealth: Payer: Self-pay | Admitting: Internal Medicine

## 2014-06-08 DIAGNOSIS — F902 Attention-deficit hyperactivity disorder, combined type: Secondary | ICD-10-CM

## 2014-06-10 MED ORDER — AMPHETAMINE-DEXTROAMPHETAMINE 20 MG PO TABS: 20.0000 mg | ORAL_TABLET | Freq: Two times a day (BID) | ORAL | Status: DC

## 2014-06-10 NOTE — Addendum Note (Signed)
Addended by: Vertell Novak A on: 06/10/2014 08:13 AM   Modules accepted: Orders

## 2014-06-10 NOTE — Telephone Encounter (Signed)
[facility] advising Rx ready for pick up.

## 2014-06-24 ENCOUNTER — Ambulatory Visit (INDEPENDENT_AMBULATORY_CARE_PROVIDER_SITE_OTHER): Payer: 59 | Admitting: Internal Medicine

## 2014-06-24 ENCOUNTER — Encounter: Payer: Self-pay | Admitting: Internal Medicine

## 2014-06-24 VITALS — BP 128/82 | HR 92 | Temp 98.1°F | Resp 16 | Ht 65.0 in | Wt 145.0 lb

## 2014-06-24 DIAGNOSIS — F902 Attention-deficit hyperactivity disorder, combined type: Secondary | ICD-10-CM | POA: Diagnosis not present

## 2014-06-24 DIAGNOSIS — F900 Attention-deficit hyperactivity disorder, predominantly inattentive type: Secondary | ICD-10-CM

## 2014-06-24 MED ORDER — AMPHETAMINE-DEXTROAMPHETAMINE 20 MG PO TABS: 20.0000 mg | ORAL_TABLET | Freq: Two times a day (BID) | ORAL | Status: DC

## 2014-06-24 NOTE — Patient Instructions (Signed)

## 2014-06-24 NOTE — Progress Notes (Signed)
Subjective:  Patient ID: Wendy Hawkins, female    DOB: Jan 24, 1976  Age: 39 y.o. MRN: [id number]  CC: ADHD   HPI Wendy Hawkins presents for follow up on ADHD, depression, anxiety, and nightmares - she tells me that the current combo of meds is helping her to sleep well with no nightmares or night terrors. She feels well and rarely takes xanax.  Outpatient Prescriptions Prior to Visit  Medication Sig Dispense Refill  . ALPRAZolam (XANAX) 0.5 MG tablet TAKE 1 TABLET BY MOUTH AT BEDTIME 30 tablet 5  . cetirizine (ZYRTEC) 10 MG tablet Take 1 tablet (10 mg total) by mouth daily. 90 tablet 3  . lamoTRIgine (LAMICTAL) 100 MG tablet TAKE 1 TABLET BY MOUTH EVERY DAY 90 tablet 1  . levonorgestrel (MIRENA) 20 MCG/24HR IUD 1 Intra Uterine Device (1 each total) by Intrauterine route once. 1 each 0  . amphetamine-dextroamphetamine (ADDERALL) 20 MG tablet Take 1 tablet (20 mg total) by mouth 2 (two) times daily. 60 tablet 0   No facility-administered medications prior to visit.    ROS Review of Systems  Constitutional: Negative.  Negative for fever, chills, diaphoresis, appetite change and fatigue.  HENT: Negative.   Eyes: Negative.   Respiratory: Negative.   Cardiovascular: Negative.  Negative for chest pain, palpitations and leg swelling.  Gastrointestinal: Negative.  Negative for nausea, vomiting, abdominal pain, diarrhea, constipation and blood in stool.  Endocrine: Negative.   Genitourinary: Negative.   Musculoskeletal: Negative.   Skin: Negative.   Allergic/Immunologic: Negative.   Neurological: Negative.   Hematological: Negative.  Negative for adenopathy. Does not bruise/bleed easily.  Psychiatric/Behavioral: Negative for suicidal ideas, hallucinations, behavioral problems, confusion, sleep disturbance, self-injury, dysphoric mood, decreased concentration and agitation. The patient is nervous/anxious. The patient is not hyperactive.     Objective:  BP 128/82 mmHg  Pulse 92  Temp(Src)  98.1 F (36.7 C) (Oral)  Resp 16  Ht 5\' 5"  (1.651 m)  Wt 145 lb (65.772 kg)  BMI 24.13 kg/m2  SpO2 96%  LMP   BP Readings from Last 3 Encounters:  06/24/14 128/82  08/27/13 120/70  01/30/13 120/70    Wt Readings from Last 3 Encounters:  06/24/14 145 lb (65.772 kg)  08/27/13 148 lb 12.8 oz (67.495 kg)  01/30/13 155 lb (70.308 kg)    Physical Exam  Constitutional: She is oriented to person, place, and time. She appears well-developed and well-nourished. No distress.  HENT:  Head: Normocephalic and atraumatic.  Mouth/Throat: Oropharynx is clear and moist. No oropharyngeal exudate.  Eyes: Conjunctivae are normal. Right eye exhibits no discharge. Left eye exhibits no discharge. No scleral icterus.  Neck: Normal range of motion. No JVD present. No tracheal deviation present. No thyromegaly present.  Cardiovascular: Normal rate, regular rhythm, normal heart sounds and intact distal pulses.  Exam reveals no gallop and no friction rub.   No murmur heard. Pulmonary/Chest: Effort normal and breath sounds normal. No stridor. No respiratory distress. She has no wheezes. She has no rales. She exhibits no tenderness.  Abdominal: Soft. Bowel sounds are normal. She exhibits no distension and no mass. There is no tenderness. There is no rebound and no guarding.  Musculoskeletal: Normal range of motion. She exhibits no edema or tenderness.  Lymphadenopathy:    She has no cervical adenopathy.  Neurological: She is oriented to person, place, and time.  Skin: Skin is warm and dry. No rash noted. She is not diaphoretic. No erythema. No pallor.  Psychiatric: She has a normal mood  and affect. Her behavior is normal. Judgment and thought content normal. Her mood appears not anxious. Her affect is not angry, not blunt, not labile and not inappropriate. Her speech is not delayed, not tangential and not slurred. She is not slowed and not withdrawn. Cognition and memory are normal. Cognition and memory are  not impaired. She does not exhibit a depressed mood. She is communicative. She exhibits normal recent memory and normal remote memory. She is attentive.  Vitals reviewed.   Lab Results  Component Value Date   WBC 7.0 10/13/2012   HGB 13.4 10/13/2012   HCT 39.2 10/13/2012   PLT 195.0 10/13/2012   GLUCOSE 92 09/25/2012   CHOL 165 09/25/2012   TRIG 48.0 09/25/2012   HDL 54.70 09/25/2012   LDLCALC 101* 09/25/2012   ALT 12 09/25/2012   AST 13 09/25/2012   NA 137 09/25/2012   K 4.2 09/25/2012   CL 103 09/25/2012   CREATININE 0.7 09/25/2012   BUN 10 09/25/2012   CO2 27 09/25/2012   TSH 1.34 09/25/2012    Mm Digital Screening Bilateral  05/25/2014   CLINICAL DATA:  Screening.  EXAM: DIGITAL SCREENING BILATERAL MAMMOGRAM WITH CAD  COMPARISON:  Previous exam(s).  ACR Breast Density Category c: The breast tissue is heterogeneously dense, which may obscure small masses.  FINDINGS: There are no findings suspicious for malignancy. Images were processed with CAD.  IMPRESSION: No mammographic evidence of malignancy. A result letter of this screening mammogram will be mailed directly to the patient.  RECOMMENDATION: Screening mammogram at age 65. (Code:SM-B-40A)  BI-RADS CATEGORY  1: Negative.   Electronically Signed   By: Lajean Manes M.D.   On: 05/25/2014 13:19    Assessment & Plan:   Naveh was seen today for adhd.  Diagnoses and all orders for this visit:  ADHD (attention deficit hyperactivity disorder), inattentive type Orders: -     Discontinue: amphetamine-dextroamphetamine (ADDERALL) 20 MG tablet; Take 1 tablet (20 mg total) by mouth 2 (two) times daily. -     amphetamine-dextroamphetamine (ADDERALL) 20 MG tablet; Take 1 tablet (20 mg total) by mouth 2 (two) times daily.  Attention deficit hyperactivity disorder (ADHD), combined type Orders: -     Discontinue: amphetamine-dextroamphetamine (ADDERALL) 20 MG tablet; Take 1 tablet (20 mg total) by mouth 2 (two) times daily. -      amphetamine-dextroamphetamine (ADDERALL) 20 MG tablet; Take 1 tablet (20 mg total) by mouth 2 (two) times daily.  I am having Ms. Burciaga maintain her levonorgestrel, cetirizine, lamoTRIgine, ALPRAZolam, and amphetamine-dextroamphetamine.  Meds ordered this encounter  Medications  . DISCONTD: amphetamine-dextroamphetamine (ADDERALL) 20 MG tablet    Sig: Take 1 tablet (20 mg total) by mouth 2 (two) times daily.    Dispense:  180 tablet    Refill:  0    Fill on or after 06/11/14  . amphetamine-dextroamphetamine (ADDERALL) 20 MG tablet    Sig: Take 1 tablet (20 mg total) by mouth 2 (two) times daily.    Dispense:  180 tablet    Refill:  0    Fill on or after 09/11/14     Follow-up: Return in about 6 months (around 12/25/2014).  Scarlette Calico, MD

## 2014-06-30 ENCOUNTER — Other Ambulatory Visit: Payer: Self-pay | Admitting: Internal Medicine

## 2014-08-30 ENCOUNTER — Other Ambulatory Visit: Payer: Self-pay | Admitting: Internal Medicine

## 2014-08-30 ENCOUNTER — Encounter: Payer: Self-pay | Admitting: Internal Medicine

## 2014-08-30 DIAGNOSIS — F900 Attention-deficit hyperactivity disorder, predominantly inattentive type: Secondary | ICD-10-CM

## 2014-08-30 DIAGNOSIS — F902 Attention-deficit hyperactivity disorder, combined type: Secondary | ICD-10-CM

## 2014-08-30 MED ORDER — AMPHETAMINE-DEXTROAMPHETAMINE 20 MG PO TABS: 20.0000 mg | ORAL_TABLET | Freq: Two times a day (BID) | ORAL | Status: DC

## 2014-10-07 ENCOUNTER — Other Ambulatory Visit: Payer: Self-pay | Admitting: Internal Medicine

## 2014-10-08 ENCOUNTER — Other Ambulatory Visit: Payer: Self-pay | Admitting: Internal Medicine

## 2014-10-08 DIAGNOSIS — F902 Attention-deficit hyperactivity disorder, combined type: Secondary | ICD-10-CM

## 2014-10-08 DIAGNOSIS — F900 Attention-deficit hyperactivity disorder, predominantly inattentive type: Secondary | ICD-10-CM

## 2014-10-08 MED ORDER — AMPHETAMINE-DEXTROAMPHETAMINE 20 MG PO TABS: 20.0000 mg | ORAL_TABLET | Freq: Two times a day (BID) | ORAL | Status: DC

## 2014-11-18 ENCOUNTER — Other Ambulatory Visit: Payer: Self-pay | Admitting: Internal Medicine

## 2014-11-18 DIAGNOSIS — F900 Attention-deficit hyperactivity disorder, predominantly inattentive type: Secondary | ICD-10-CM

## 2014-11-18 DIAGNOSIS — F902 Attention-deficit hyperactivity disorder, combined type: Secondary | ICD-10-CM

## 2014-11-18 MED ORDER — AMPHETAMINE-DEXTROAMPHETAMINE 20 MG PO TABS: 20.0000 mg | ORAL_TABLET | Freq: Two times a day (BID) | ORAL | Status: DC

## 2014-11-18 MED ORDER — ALPRAZOLAM 0.5 MG PO TABS: 0.5000 mg | ORAL_TABLET | Freq: Every day | ORAL | Status: DC

## 2014-12-06 ENCOUNTER — Other Ambulatory Visit: Payer: Self-pay | Admitting: Nurse Practitioner

## 2014-12-06 DIAGNOSIS — N6322 Unspecified lump in the left breast, upper inner quadrant: Secondary | ICD-10-CM

## 2014-12-13 ENCOUNTER — Ambulatory Visit
Admission: RE | Admit: 2014-12-13 | Discharge: 2014-12-13 | Disposition: A | Discharge status: Home / Self Care | Payer: 59 | Source: Ambulatory Visit | Attending: Nurse Practitioner | Admitting: Nurse Practitioner

## 2014-12-13 DIAGNOSIS — N6322 Unspecified lump in the left breast, upper inner quadrant: Secondary | ICD-10-CM

## 2014-12-28 ENCOUNTER — Telehealth: Payer: Self-pay | Admitting: *Deleted

## 2014-12-28 DIAGNOSIS — F902 Attention-deficit hyperactivity disorder, combined type: Secondary | ICD-10-CM

## 2014-12-28 DIAGNOSIS — F900 Attention-deficit hyperactivity disorder, predominantly inattentive type: Secondary | ICD-10-CM

## 2014-12-28 MED ORDER — AMPHETAMINE-DEXTROAMPHETAMINE 20 MG PO TABS: 20.0000 mg | ORAL_TABLET | Freq: Two times a day (BID) | ORAL | Status: DC

## 2014-12-28 NOTE — Telephone Encounter (Signed)
done

## 2014-12-28 NOTE — Telephone Encounter (Signed)
At front
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
## 2014-12-30 ENCOUNTER — Other Ambulatory Visit: Payer: Self-pay | Admitting: Internal Medicine

## 2014-12-30 DIAGNOSIS — F902 Attention-deficit hyperactivity disorder, combined type: Secondary | ICD-10-CM

## 2014-12-30 DIAGNOSIS — F900 Attention-deficit hyperactivity disorder, predominantly inattentive type: Secondary | ICD-10-CM

## 2014-12-30 MED ORDER — AMPHETAMINE-DEXTROAMPHETAMINE 20 MG PO TABS: 20.0000 mg | ORAL_TABLET | Freq: Two times a day (BID) | ORAL | Status: DC

## 2014-12-30 NOTE — Telephone Encounter (Signed)
LM[facility]M informing hershe already has fills at her pharmacy

## 2014-12-30 NOTE — Telephone Encounter (Signed)
Patient has called back in regards to script for xanax.  Please give call back at [phone]

## 2014-12-31 ENCOUNTER — Telehealth: Payer: Self-pay | Admitting: *Deleted
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
## 2015-01-21 ENCOUNTER — Encounter: Payer: Self-pay | Admitting: Nurse Practitioner

## 2015-02-06 ENCOUNTER — Other Ambulatory Visit: Payer: Self-pay | Admitting: Internal Medicine

## 2015-03-11 ENCOUNTER — Telehealth: Payer: Self-pay

## 2015-03-11 NOTE — Telephone Encounter (Signed)
She is way overdue for an appointment with me, please schedule

## 2015-03-11 NOTE — Telephone Encounter (Signed)
Recd rx refill request faxed to office for Adderall 20mg  tablet (take 1 tablet by mouth twice daily)----routing to dr Ronnald Ramp, please advise, thanks---patient pharmacy request from [facility] on [address]/[place]

## 2015-03-14 ENCOUNTER — Telehealth: Payer: Self-pay | Admitting: Internal Medicine

## 2015-03-14 NOTE — Telephone Encounter (Signed)
[facility] is calling for clarification on the adderrall script. Please call them to clarify the dates on the script.

## 2015-03-16 ENCOUNTER — Other Ambulatory Visit: Payer: Self-pay | Admitting: Internal Medicine

## 2015-03-16 NOTE — Telephone Encounter (Signed)
Ok to fill script. Pharmacy states pt is not filling anywhere else 12/2014 and 01/2015 Lorre Nick gave verbals to okay this med at this date. It is time for fill. Verbal given

## 2015-03-16 NOTE — Telephone Encounter (Signed)
Wendy Hawkins,  Please give [facility] a call today before you leave to clarify these dates. ty so much

## 2015-03-21 NOTE — Telephone Encounter (Signed)
Left message asking patient to call back in to schedule appt with dr Hardie Pulley refill adderall until after dr Ronnald Ramp sees patient per dr Ronnald Ramp note below

## 2015-04-14 ENCOUNTER — Other Ambulatory Visit (INDEPENDENT_AMBULATORY_CARE_PROVIDER_SITE_OTHER): Payer: 59

## 2015-04-14 ENCOUNTER — Ambulatory Visit (INDEPENDENT_AMBULATORY_CARE_PROVIDER_SITE_OTHER): Payer: 59 | Admitting: Internal Medicine

## 2015-04-14 ENCOUNTER — Ambulatory Visit: Payer: 59 | Admitting: Internal Medicine

## 2015-04-14 ENCOUNTER — Encounter: Payer: Self-pay | Admitting: Internal Medicine

## 2015-04-14 VITALS — BP 138/90 | HR 80 | Temp 98.3°F | Resp 16 | Ht 65.0 in | Wt 173.0 lb

## 2015-04-14 DIAGNOSIS — F411 Generalized anxiety disorder: Secondary | ICD-10-CM

## 2015-04-14 DIAGNOSIS — R635 Abnormal weight gain: Secondary | ICD-10-CM | POA: Diagnosis not present

## 2015-04-14 DIAGNOSIS — Z0001 Encounter for general adult medical examination with abnormal findings: Secondary | ICD-10-CM

## 2015-04-14 DIAGNOSIS — E559 Vitamin D deficiency, unspecified: Secondary | ICD-10-CM

## 2015-04-14 DIAGNOSIS — F902 Attention-deficit hyperactivity disorder, combined type: Secondary | ICD-10-CM | POA: Diagnosis not present

## 2015-04-14 DIAGNOSIS — F900 Attention-deficit hyperactivity disorder, predominantly inattentive type: Secondary | ICD-10-CM | POA: Diagnosis not present

## 2015-04-14 DIAGNOSIS — Z0000 Encounter for general adult medical examination without abnormal findings: Secondary | ICD-10-CM

## 2015-04-14 LAB — T3: T3, Total: 161 ng/dL (ref 76–181)

## 2015-04-14 LAB — LIPID PANEL
CHOLESTEROL: 166 mg/dL (ref 0–200)
HDL: 54.9 mg/dL (ref >39.00)
LDL Cholesterol: 100 mg/dL — ABNORMAL HIGH (ref 0–99)
NonHDL: 111.17
Total CHOL/HDL Ratio: 3
Triglycerides: 58 mg/dL (ref 0.0–149.0)
VLDL: 11.6 mg/dL (ref 0.0–40.0)

## 2015-04-14 LAB — COMPREHENSIVE METABOLIC PANEL
ALK PHOS: 61 U/L (ref 39–117)
ALT: 20 U/L (ref 0–35)
AST: 24 U/L (ref 0–37)
Albumin: 4.7 g/dL (ref 3.5–5.2)
BILIRUBIN TOTAL: 1.2 mg/dL (ref 0.2–1.2)
BUN: 10 mg/dL (ref 6–23)
CO2: 29 mEq/L (ref 19–32)
Calcium: 9.3 mg/dL (ref 8.4–10.5)
Chloride: 101 mEq/L (ref 96–112)
Creatinine, Ser: 0.68 mg/dL (ref 0.40–1.20)
GFR: 102.62 mL/min (ref >60.00)
Glucose, Bld: 92 mg/dL (ref 70–99)
POTASSIUM: 3.9 meq/L (ref 3.5–5.1)
SODIUM: 137 meq/L (ref 135–145)
TOTAL PROTEIN: 7.6 g/dL (ref 6.0–8.3)

## 2015-04-14 LAB — CBC WITH DIFFERENTIAL/PLATELET
Basophils Absolute: 0.1 10*3/uL (ref 0.0–0.1)
Basophils Relative: 0.9 % (ref 0.0–3.0)
EOS ABS: 0.1 10*3/uL (ref 0.0–0.7)
EOS PCT: 2 % (ref 0.0–5.0)
HCT: 41.4 % (ref 36.0–46.0)
Hemoglobin: 13.9 g/dL (ref 12.0–15.0)
Lymphocytes Relative: 33.8 % (ref 12.0–46.0)
Lymphs Abs: 2.5 10*3/uL (ref 0.7–4.0)
MCHC: 33.6 g/dL (ref 30.0–36.0)
MCV: 88.9 fl (ref 78.0–100.0)
Monocytes Absolute: 0.3 10*3/uL (ref 0.1–1.0)
Monocytes Relative: 4.5 % (ref 3.0–12.0)
NEUTROS PCT: 58.8 % (ref 43.0–77.0)
Neutro Abs: 4.4 10*3/uL (ref 1.4–7.7)
PLATELETS: 282 10*3/uL (ref 150.0–400.0)
RBC: 4.66 Mil/uL (ref 3.87–5.11)
RDW: 13.4 % (ref 11.5–15.5)
WBC: 7.5 10*3/uL (ref 4.0–10.5)

## 2015-04-14 LAB — T3 UPTAKE: T3 UPTAKE: 30 % (ref 22–35)

## 2015-04-14 LAB — TSH: TSH: 2.12 u[IU]/mL (ref 0.35–4.50)

## 2015-04-14 LAB — T4, FREE: Free T4: 1.06 ng/dL (ref 0.60–1.60)

## 2015-04-14 MED ORDER — AMPHETAMINE-DEXTROAMPHETAMINE 20 MG PO TABS: 20.0000 mg | ORAL_TABLET | Freq: Two times a day (BID) | ORAL | Status: DC

## 2015-04-14 MED ORDER — ALPRAZOLAM 0.5 MG PO TABS: 0.5000 mg | ORAL_TABLET | Freq: Every day | ORAL | Status: DC

## 2015-04-14 NOTE — Progress Notes (Signed)
Pre visit review using our clinic review tool, if applicable. No additional management support is needed unless otherwise documented below in the visit note. 

## 2015-04-14 NOTE — Progress Notes (Signed)
Subjective:  Patient ID: Wendy Hawkins, female    DOB: 1975-04-29  Age: 40 y.o. MRN: [id number]  CC: Annual Exam   HPI Telia Louro presents for an annual physical as well as refill on medications for PTSD, anxiety disorder, and ADHD. She complains of weight gain, she has gained about 30 pounds over the last 9 months. She attributes this to changing jobs and now is sedentary throughout the day.   Pt states ADD status overall stable on current meds with overall good compliance and tolerability, and good effectiveness with respect to ability for concentration and task completion.  Outpatient Prescriptions Prior to Visit  Medication Sig Dispense Refill  . cetirizine (ZYRTEC) 10 MG tablet Take 1 tablet (10 mg total) by mouth daily. 90 tablet 3  . lamoTRIgine (LAMICTAL) 100 MG tablet TAKE 1 TABLET BY MOUTH EVERY DAY 90 tablet 1  . levonorgestrel (MIRENA) 20 MCG/24HR IUD 1 Intra Uterine Device (1 each total) by Intrauterine route once. 1 each 0  . ALPRAZolam (XANAX) 0.5 MG tablet Take 1 tablet (0.5 mg total) by mouth at bedtime. 30 tablet 3  . amphetamine-dextroamphetamine (ADDERALL) 20 MG tablet Take 1 tablet (20 mg total) by mouth 2 (two) times daily. 60 tablet 0  . lamoTRIgine (LAMICTAL) 100 MG tablet TAKE 1 TABLET BY MOUTH EVERY DAY 90 tablet 3   No facility-administered medications prior to visit.    ROS Review of Systems  Constitutional: Positive for unexpected weight change. Negative for fever, chills, diaphoresis, appetite change and fatigue.  HENT: Negative.   Eyes: Negative.  Negative for visual disturbance.  Respiratory: Negative.  Negative for cough.   Cardiovascular: Negative.  Negative for chest pain.  Gastrointestinal: Negative.  Negative for nausea, abdominal pain and constipation.  Endocrine: Negative.  Negative for polydipsia, polyphagia and polyuria.  Genitourinary: Negative.   Musculoskeletal: Negative.  Negative for neck pain.  Skin: Negative for color change and  rash.  Allergic/Immunologic: Negative.   Neurological: Negative.  Negative for dizziness and headaches.  Hematological: Negative.  Negative for adenopathy. Does not bruise/bleed easily.  Psychiatric/Behavioral: Positive for sleep disturbance and decreased concentration. Negative for suicidal ideas, hallucinations, behavioral problems, confusion, self-injury, dysphoric mood and agitation. The patient is nervous/anxious. The patient is not hyperactive.     Objective:  BP 138/90 mmHg  Pulse 80  Temp(Src) 98.3 F (36.8 C) (Oral)  Resp 16  Ht 5\' 5"  (1.651 m)  Wt 173 lb (78.472 kg)  BMI 28.79 kg/m2  SpO2 98%  BP Readings from Last 3 Encounters:  04/14/15 138/90  06/24/14 128/82  08/27/13 120/70    Wt Readings from Last 3 Encounters:  04/14/15 173 lb (78.472 kg)  06/24/14 145 lb (65.772 kg)  08/27/13 148 lb 12.8 oz (67.495 kg)    Physical Exam  Constitutional: She is oriented to person, place, and time. No distress.  HENT:  Head: Normocephalic and atraumatic.  Mouth/Throat: Oropharynx is clear and moist. No oropharyngeal exudate.  Eyes: Conjunctivae are normal. Right eye exhibits no discharge. Left eye exhibits no discharge. No scleral icterus.  Neck: Normal range of motion. Neck supple. No JVD present. No tracheal deviation present. No thyroid mass and no thyromegaly present.  Cardiovascular: Normal rate, regular rhythm, normal heart sounds and intact distal pulses.  Exam reveals no gallop and no friction rub.   No murmur heard. Pulmonary/Chest: Effort normal and breath sounds normal. No stridor. No respiratory distress. She has no wheezes. She has no rales. She exhibits no tenderness.  Abdominal: Soft.  Bowel sounds are normal. She exhibits no distension and no mass. There is no tenderness. There is no rebound and no guarding.  Musculoskeletal: Normal range of motion. She exhibits no edema or tenderness.  Lymphadenopathy:    She has no cervical adenopathy.  Neurological: She is  oriented to person, place, and time.  Skin: Skin is warm and dry. No rash noted. She is not diaphoretic. No erythema. No pallor.  Psychiatric: She has a normal mood and affect. Her behavior is normal. Judgment and thought content normal.  Vitals reviewed.   Lab Results  Component Value Date   WBC 7.5 04/14/2015   HGB 13.9 04/14/2015   HCT 41.4 04/14/2015   PLT 282.0 04/14/2015   GLUCOSE 92 04/14/2015   CHOL 166 04/14/2015   TRIG 58.0 04/14/2015   HDL 54.90 04/14/2015   LDLCALC 100* 04/14/2015   ALT 20 04/14/2015   AST 24 04/14/2015   NA 137 04/14/2015   K 3.9 04/14/2015   CL 101 04/14/2015   CREATININE 0.68 04/14/2015   BUN 10 04/14/2015   CO2 29 04/14/2015   TSH 2.12 04/14/2015    US Breast [place] Ax[place]  12/13/2014  CLINICAL DATA:  Mass felt on recent physical examination in the upper outer left breast at the location of focal tenderness for the past 3 weeks. EXAM: DIGITAL DIAGNOSTIC LEFT MAMMOGRAM WITH 3D TOMOSYNTHESIS WITH CAD ULTRASOUND LEFT BREAST COMPARISON:  Previous exam(s). ACR Breast Density Category c: The breast tissue is heterogeneously dense, which may obscure small masses. FINDINGS: Stable mammographic appearance of the left breast with no findings suspicious for malignancy. Mammographic images were processed with CAD. On physical exam, the patient is focally tender to palpation in the 2 o'clock position of the left breast, 4 cm from the nipple. There is no palpable mass at that location today. Targeted ultrasound is performed, showing normal appearing breast tissue throughout the upper-outer left breast in the area of concern, including normal dense glandular tissue. IMPRESSION: No evidence of malignancy. RECOMMENDATION: Bilateral screening mammogram in 5 months. That will be 1 year since mammographic evaluation of the right breast. I have discussed the findings and recommendations with the patient. Results were also provided in writing at the conclusion of  the visit. If applicable, a reminder letter will be sent to the patient regarding the next appointment. BI-RADS CATEGORY  1: Negative. Electronically Signed   By: Claudie Revering M.D.   On: 12/13/2014 08:22   Mm Diag Breast [place] Left  12/13/2014  CLINICAL DATA:  Mass felt on recent physical examination in the upper outer left breast at the location of focal tenderness for the past 3 weeks. EXAM: DIGITAL DIAGNOSTIC LEFT MAMMOGRAM WITH 3D TOMOSYNTHESIS WITH CAD ULTRASOUND LEFT BREAST COMPARISON:  Previous exam(s). ACR Breast Density Category c: The breast tissue is heterogeneously dense, which may obscure small masses. FINDINGS: Stable mammographic appearance of the left breast with no findings suspicious for malignancy. Mammographic images were processed with CAD. On physical exam, the patient is focally tender to palpation in the 2 o'clock position of the left breast, 4 cm from the nipple. There is no palpable mass at that location today. Targeted ultrasound is performed, showing normal appearing breast tissue throughout the upper-outer left breast in the area of concern, including normal dense glandular tissue. IMPRESSION: No evidence of malignancy. RECOMMENDATION: Bilateral screening mammogram in 5 months. That will be 1 year since mammographic evaluation of the right breast. I have discussed the findings and recommendations  with the patient. Results were also provided in writing at the conclusion of the visit. If applicable, a reminder letter will be sent to the patient regarding the next appointment. BI-RADS CATEGORY  1: Negative. Electronically Signed   By: Claudie Revering M.D.   On: 12/13/2014 08:22    Assessment & Plan:   Toshiana was seen today for annual exam.  Diagnoses and all orders for this visit:  Attention deficit hyperactivity disorder (ADHD), combined type -     Discontinue: amphetamine-dextroamphetamine (ADDERALL) 20 MG tablet; Take 1 tablet (20 mg total) by mouth 2 (two) times daily. -      Discontinue: amphetamine-dextroamphetamine (ADDERALL) 20 MG tablet; Take 1 tablet (20 mg total) by mouth 2 (two) times daily. -     Discontinue: amphetamine-dextroamphetamine (ADDERALL) 20 MG tablet; Take 1 tablet (20 mg total) by mouth 2 (two) times daily. -     amphetamine-dextroamphetamine (ADDERALL) 20 MG tablet; Take 1 tablet (20 mg total) by mouth 2 (two) times daily.  ADHD (attention deficit hyperactivity disorder), inattentive type -     Discontinue: amphetamine-dextroamphetamine (ADDERALL) 20 MG tablet; Take 1 tablet (20 mg total) by mouth 2 (two) times daily. -     Discontinue: amphetamine-dextroamphetamine (ADDERALL) 20 MG tablet; Take 1 tablet (20 mg total) by mouth 2 (two) times daily. -     Discontinue: amphetamine-dextroamphetamine (ADDERALL) 20 MG tablet; Take 1 tablet (20 mg total) by mouth 2 (two) times daily. -     amphetamine-dextroamphetamine (ADDERALL) 20 MG tablet; Take 1 tablet (20 mg total) by mouth 2 (two) times daily.  GAD (generalized anxiety disorder)- she is doing well with respect to this, we'll continue lamotrigine at the current dose and Xanax as needed for night terrors. -     ALPRAZolam (XANAX) 0.5 MG tablet; Take 1 tablet (0.5 mg total) by mouth at bedtime.  Routine general medical examination at a health care facility- her vaccines reviewed and updated, her Pap smears up-to-date, exam completed, labs ordered and reviewed. -     Lipid panel; Future -     Comprehensive metabolic panel; Future -     CBC with Differential/Platelet; Future -     TSH; Future  Vitamin D deficiency  Weight gain, abnormal- she is asymptomatic other than the weight gain, her labs do not show any secondary causes for weight gain and screening for Graves' disease and Hashimoto's thyroiditis is negative, her thyroid functions are all normal. This is most likely related to her sedentary lifestyle. She agrees to consume fewer calories and be more active. -     T3; Future -     T3  uptake; Future -     T4, free; Future -     Thyroid peroxidase antibody; Future  I am having Ms. Schuessler maintain her levonorgestrel, cetirizine, lamoTRIgine, ALPRAZolam, and amphetamine-dextroamphetamine.  Meds ordered this encounter  Medications  . ALPRAZolam (XANAX) 0.5 MG tablet    Sig: Take 1 tablet (0.5 mg total) by mouth at bedtime.    Dispense:  30 tablet    Refill:  3  . DISCONTD: amphetamine-dextroamphetamine (ADDERALL) 20 MG tablet    Sig: Take 1 tablet (20 mg total) by mouth 2 (two) times daily.    Dispense:  60 tablet    Refill:  0    Fill on or after 04/14/15  . DISCONTD: amphetamine-dextroamphetamine (ADDERALL) 20 MG tablet    Sig: Take 1 tablet (20 mg total) by mouth 2 (two) times daily.    Dispense:  60 tablet    Refill:  0    Fill on or after 05/15/15  . DISCONTD: amphetamine-dextroamphetamine (ADDERALL) 20 MG tablet    Sig: Take 1 tablet (20 mg total) by mouth 2 (two) times daily.    Dispense:  60 tablet    Refill:  0    Fill on or after 06/14/15  . amphetamine-dextroamphetamine (ADDERALL) 20 MG tablet    Sig: Take 1 tablet (20 mg total) by mouth 2 (two) times daily.    Dispense:  60 tablet    Refill:  0    Fill on or after 07/15/15     Follow-up: Return in about 4 months (around 08/14/2015).  Scarlette Calico, MD

## 2015-04-14 NOTE — Patient Instructions (Signed)
Preventive Care for Adults, Female A healthy lifestyle and preventive care can promote health and wellness. Preventive health guidelines for women include the following key practices.  A routine yearly physical is a good way to check with your health care provider about your health and preventive screening. It is a chance to share any concerns and updates on your health and to receive a thorough exam.  Visit your dentist for a routine exam and preventive care every 6 months. Brush your teeth twice a day and floss once a day. Good oral hygiene prevents tooth decay and gum disease.  The frequency of eye exams is based on your age, health, family medical history, use of contact lenses, and other factors. Follow your health care provider's recommendations for frequency of eye exams.  Eat a healthy diet. Foods like vegetables, fruits, whole grains, low-fat dairy products, and lean protein foods contain the nutrients you need without too many calories. Decrease your intake of foods high in solid fats, added sugars, and salt. Eat the right amount of calories for you.Get information about a proper diet from your health care provider, if necessary.  Regular physical exercise is one of the most important things you can do for your health. Most adults should get at least 150 minutes of moderate-intensity exercise (any activity that increases your heart rate and causes you to sweat) each week. In addition, most adults need muscle-strengthening exercises on 2 or more days a week.  Maintain a healthy weight. The body mass index (BMI) is a screening tool to identify possible weight problems. It provides an estimate of body fat based on height and weight. Your health care provider can find your BMI and can help you achieve or maintain a healthy weight.For adults 20 years and older:  A BMI below 18.5 is considered underweight.  A BMI of 18.5 to 24.9 is normal.  A BMI of 25 to 29.9 is considered overweight.  A  BMI of 30 and above is considered obese.  Maintain normal blood lipids and cholesterol levels by exercising and minimizing your intake of saturated fat. Eat a balanced diet with plenty of fruit and vegetables. Blood tests for lipids and cholesterol should begin at age 45 and be repeated every 5 years. If your lipid or cholesterol levels are high, you are over 50, or you are at high risk for heart disease, you may need your cholesterol levels checked more frequently.Ongoing high lipid and cholesterol levels should be treated with medicines if diet and exercise are not working.  If you smoke, find out from your health care provider how to quit. If you do not use tobacco, do not start.  Lung cancer screening is recommended for adults aged 45-80 years who are at high risk for developing lung cancer because of a history of smoking. A yearly low-dose CT scan of the lungs is recommended for people who have at least a 30-pack-year history of smoking and are a current smoker or have quit within the past 15 years. A pack year of smoking is smoking an average of 1 pack of cigarettes a day for 1 year (for example: 1 pack a day for 30 years or 2 packs a day for 15 years). Yearly screening should continue until the smoker has stopped smoking for at least 15 years. Yearly screening should be stopped for people who develop a health problem that would prevent them from having lung cancer treatment.  If you are pregnant, do not drink alcohol. If you are  breastfeeding, be very cautious about drinking alcohol. If you are not pregnant and choose to drink alcohol, do not have more than 1 drink per day. One drink is considered to be 12 ounces (355 mL) of beer, 5 ounces (148 mL) of wine, or 1.5 ounces (44 mL) of liquor.  Avoid use of street drugs. Do not share needles with anyone. Ask for help if you need support or instructions about stopping the use of drugs.  High blood pressure causes heart disease and increases the risk  of stroke. Your blood pressure should be checked at least every 1 to 2 years. Ongoing high blood pressure should be treated with medicines if weight loss and exercise do not work.  If you are 55-79 years old, ask your health care provider if you should take aspirin to prevent strokes.  Diabetes screening is done by taking a blood sample to check your blood glucose level after you have not eaten for a certain period of time (fasting). If you are not overweight and you do not have risk factors for diabetes, you should be screened once every 3 years starting at age 45. If you are overweight or obese and you are 40-70 years of age, you should be screened for diabetes every year as part of your cardiovascular risk assessment.  Breast cancer screening is essential preventive care for women. You should practice "breast self-awareness." This means understanding the normal appearance and feel of your breasts and may include breast self-examination. Any changes detected, no matter how small, should be reported to a health care provider. Women in their 20s and 30s should have a clinical breast exam (CBE) by a health care provider as part of a regular health exam every 1 to 3 years. After age 40, women should have a CBE every year. Starting at age 40, women should consider having a mammogram (breast X-ray test) every year. Women who have a family history of breast cancer should talk to their health care provider about genetic screening. Women at a high risk of breast cancer should talk to their health care providers about having an MRI and a mammogram every year.  Breast cancer gene (BRCA)-related cancer risk assessment is recommended for women who have family members with BRCA-related cancers. BRCA-related cancers include breast, ovarian, tubal, and peritoneal cancers. Having family members with these cancers may be associated with an increased risk for harmful changes (mutations) in the breast cancer genes BRCA1 and  BRCA2. Results of the assessment will determine the need for genetic counseling and BRCA1 and BRCA2 testing.  Your health care provider may recommend that you be screened regularly for cancer of the pelvic organs (ovaries, uterus, and vagina). This screening involves a pelvic examination, including checking for microscopic changes to the surface of your cervix (Pap test). You may be encouraged to have this screening done every 3 years, beginning at age 21.  For women ages 30-65, health care providers may recommend pelvic exams and Pap testing every 3 years, or they may recommend the Pap and pelvic exam, combined with testing for human papilloma virus (HPV), every 5 years. Some types of HPV increase your risk of cervical cancer. Testing for HPV may also be done on women of any age with unclear Pap test results.  Other health care providers may not recommend any screening for nonpregnant women who are considered low risk for pelvic cancer and who do not have symptoms. Ask your health care provider if a screening pelvic exam is right for   you.  If you have had past treatment for cervical cancer or a condition that could lead to cancer, you need Pap tests and screening for cancer for at least 20 years after your treatment. If Pap tests have been discontinued, your risk factors (such as having a new sexual partner) need to be reassessed to determine if screening should resume. Some women have medical problems that increase the chance of getting cervical cancer. In these cases, your health care provider may recommend more frequent screening and Pap tests.  Colorectal cancer can be detected and often prevented. Most routine colorectal cancer screening begins at the age of 62 years and continues through age 36 years. However, your health care provider may recommend screening at an earlier age if you have risk factors for colon cancer. On a yearly basis, your health care provider may provide home test kits to check  for hidden blood in the stool. Use of a small camera at the end of a tube, to directly examine the colon (sigmoidoscopy or colonoscopy), can detect the earliest forms of colorectal cancer. Talk to your health care provider about this at age 60, when routine screening begins. Direct exam of the colon should be repeated every 5-10 years through age 64 years, unless early forms of precancerous polyps or small growths are found.  People who are at an increased risk for hepatitis B should be screened for this virus. You are considered at high risk for hepatitis B if:  You were born in a country where hepatitis B occurs often. Talk with your health care provider about which countries are considered high risk.  Your parents were born in a high-risk country and you have not received a shot to protect against hepatitis B (hepatitis B vaccine).  You have HIV or AIDS.  You use needles to inject street drugs.  You live with, or have sex with, someone who has hepatitis B.  You get hemodialysis treatment.  You take certain medicines for conditions like cancer, organ transplantation, and autoimmune conditions.  Hepatitis C blood testing is recommended for all people born from [birth year] through 1964 and any individual with known risks for hepatitis C.  Practice safe sex. Use condoms and avoid high-risk sexual practices to reduce the spread of sexually transmitted infections (STIs). STIs include gonorrhea, chlamydia, syphilis, trichomonas, herpes, HPV, and human immunodeficiency virus (HIV). Herpes, HIV, and HPV are viral illnesses that have no cure. They can result in disability, cancer, and death.  You should be screened for sexually transmitted illnesses (STIs) including gonorrhea and chlamydia if:  You are sexually active and are younger than 24 years.  You are older than 24 years and your health care provider tells you that you are at risk for this type of infection.  Your sexual activity has changed  since you were last screened and you are at an increased risk for chlamydia or gonorrhea. Ask your health care provider if you are at risk.  If you are at risk of being infected with HIV, it is recommended that you take a prescription medicine daily to prevent HIV infection. This is called preexposure prophylaxis (PrEP). You are considered at risk if:  You are sexually active and do not regularly use condoms or know the HIV status of your partner(s).  You take drugs by injection.  You are sexually active with a partner who has HIV.  Talk with your health care provider about whether you are at high risk of being infected with HIV. If  you choose to begin PrEP, you should first be tested for HIV. You should then be tested every 3 months for as long as you are taking PrEP.  Osteoporosis is a disease in which the bones lose minerals and strength with aging. This can result in serious bone fractures or breaks. The risk of osteoporosis can be identified using a bone density scan. Women ages 67 years and over and women at risk for fractures or osteoporosis should discuss screening with their health care providers. Ask your health care provider whether you should take a calcium supplement or vitamin D to reduce the rate of osteoporosis.  Menopause can be associated with physical symptoms and risks. Hormone replacement therapy is available to decrease symptoms and risks. You should talk to your health care provider about whether hormone replacement therapy is right for you.  Use sunscreen. Apply sunscreen liberally and repeatedly throughout the day. You should seek shade when your shadow is shorter than you. Protect yourself by wearing long sleeves, pants, a wide-brimmed hat, and sunglasses year round, whenever you are outdoors.  Once a month, do a whole body skin exam, using a mirror to look at the skin on your back. Tell your health care provider of new moles, moles that have irregular borders, moles that  are larger than a pencil eraser, or moles that have changed in shape or color.  Stay current with required vaccines (immunizations).  Influenza vaccine. All adults should be immunized every year.  Tetanus, diphtheria, and acellular pertussis (Td, Tdap) vaccine. Pregnant women should receive 1 dose of Tdap vaccine during each pregnancy. The dose should be obtained regardless of the length of time since the last dose. Immunization is preferred during the 27th-36th week of gestation. An adult who has not previously received Tdap or who does not know her vaccine status should receive 1 dose of Tdap. This initial dose should be followed by tetanus and diphtheria toxoids (Td) booster doses every 10 years. Adults with an unknown or incomplete history of completing a 3-dose immunization series with Td-containing vaccines should begin or complete a primary immunization series including a Tdap dose. Adults should receive a Td booster every 10 years.  Varicella vaccine. An adult without evidence of immunity to varicella should receive 2 doses or a second dose if she has previously received 1 dose. Pregnant females who do not have evidence of immunity should receive the first dose after pregnancy. This first dose should be obtained before leaving the health care facility. The second dose should be obtained 4-8 weeks after the first dose.  Human papillomavirus (HPV) vaccine. Females aged 13-26 years who have not received the vaccine previously should obtain the 3-dose series. The vaccine is not recommended for use in pregnant females. However, pregnancy testing is not needed before receiving a dose. If a female is found to be pregnant after receiving a dose, no treatment is needed. In that case, the remaining doses should be delayed until after the pregnancy. Immunization is recommended for any person with an immunocompromised condition through the age of 61 years if she did not get any or all doses earlier. During the  3-dose series, the second dose should be obtained 4-8 weeks after the first dose. The third dose should be obtained 24 weeks after the first dose and 16 weeks after the second dose.  Zoster vaccine. One dose is recommended for adults aged 30 years or older unless certain conditions are present.  Measles, mumps, and rubella (MMR) vaccine. Adults born  before 1957 generally are considered immune to measles and mumps. Adults born in 1957 or later should have 1 or more doses of MMR vaccine unless there is a contraindication to the vaccine or there is laboratory evidence of immunity to each of the three diseases. A routine second dose of MMR vaccine should be obtained at least 28 days after the first dose for students attending postsecondary schools, health care workers, or international travelers. People who received inactivated measles vaccine or an unknown type of measles vaccine during 1963-1967 should receive 2 doses of MMR vaccine. People who received inactivated mumps vaccine or an unknown type of mumps vaccine before 1979 and are at high risk for mumps infection should consider immunization with 2 doses of MMR vaccine. For females of childbearing age, rubella immunity should be determined. If there is no evidence of immunity, females who are not pregnant should be vaccinated. If there is no evidence of immunity, females who are pregnant should delay immunization until after pregnancy. Unvaccinated health care workers born before 1957 who lack laboratory evidence of measles, mumps, or rubella immunity or laboratory confirmation of disease should consider measles and mumps immunization with 2 doses of MMR vaccine or rubella immunization with 1 dose of MMR vaccine.  Pneumococcal 13-valent conjugate (PCV13) vaccine. When indicated, a person who is uncertain of his immunization history and has no record of immunization should receive the PCV13 vaccine. All adults 65 years of age and older should receive this  vaccine. An adult aged 19 years or older who has certain medical conditions and has not been previously immunized should receive 1 dose of PCV13 vaccine. This PCV13 should be followed with a dose of pneumococcal polysaccharide (PPSV23) vaccine. Adults who are at high risk for pneumococcal disease should obtain the PPSV23 vaccine at least 8 weeks after the dose of PCV13 vaccine. Adults older than 39 years of age who have normal immune system function should obtain the PPSV23 vaccine dose at least 1 year after the dose of PCV13 vaccine.  Pneumococcal polysaccharide (PPSV23) vaccine. When PCV13 is also indicated, PCV13 should be obtained first. All adults aged 65 years and older should be immunized. An adult younger than age 65 years who has certain medical conditions should be immunized. Any person who resides in a nursing home or long-term care facility should be immunized. An adult smoker should be immunized. People with an immunocompromised condition and certain other conditions should receive both PCV13 and PPSV23 vaccines. People with human immunodeficiency virus (HIV) infection should be immunized as soon as possible after diagnosis. Immunization during chemotherapy or radiation therapy should be avoided. Routine use of PPSV23 vaccine is not recommended for American Indians, [place] Natives, or people younger than 65 years unless there are medical conditions that require PPSV23 vaccine. When indicated, people who have unknown immunization and have no record of immunization should receive PPSV23 vaccine. One-time revaccination 5 years after the first dose of PPSV23 is recommended for people aged 19-64 years who have chronic kidney failure, nephrotic syndrome, asplenia, or immunocompromised conditions. People who received 1-2 doses of PPSV23 before age 65 years should receive another dose of PPSV23 vaccine at age 65 years or later if at least 5 years have passed since the previous dose. Doses of PPSV23 are not  needed for people immunized with PPSV23 at or after age 65 years.  Meningococcal vaccine. Adults with asplenia or persistent complement component deficiencies should receive 2 doses of quadrivalent meningococcal conjugate (MenACWY-D) vaccine. The doses should be obtained   at least 2 months apart. Microbiologists working with certain meningococcal bacteria, [employer] recruits, people at risk during an outbreak, and people who travel to or live in countries with a high rate of meningitis should be immunized. A first-year college student up through age 30 years who is living in a residence hall should receive a dose if she did not receive a dose on or after her 16th birthday. Adults who have certain high-risk conditions should receive one or more doses of vaccine.  Hepatitis A vaccine. Adults who wish to be protected from this disease, have certain high-risk conditions, work with hepatitis A-infected animals, work in hepatitis A research labs, or travel to or work in countries with a high rate of hepatitis A should be immunized. Adults who were previously unvaccinated and who anticipate close contact with an international adoptee during the first 60 days after arrival in the [place] from a country with a high rate of hepatitis A should be immunized.  Hepatitis B vaccine. Adults who wish to be protected from this disease, have certain high-risk conditions, may be exposed to blood or other infectious body fluids, are household contacts or sex partners of hepatitis B positive people, are clients or workers in certain care facilities, or travel to or work in countries with a high rate of hepatitis B should be immunized.  Haemophilus influenzae type b (Hib) vaccine. A previously unvaccinated person with asplenia or sickle cell disease or having a scheduled splenectomy should receive 1 dose of Hib vaccine. Regardless of previous immunization, a recipient of a hematopoietic stem cell transplant should receive a  3-dose series 6-12 months after her successful transplant. Hib vaccine is not recommended for adults with HIV infection. Preventive Services / Frequency Ages 50 to 53 years  Blood pressure check.** / Every 3-5 years.  Lipid and cholesterol check.** / Every 5 years beginning at age 13.  Clinical breast exam.** / Every 3 years for women in their 90s and 46s.  BRCA-related cancer risk assessment.** / For women who have family members with a BRCA-related cancer (breast, ovarian, tubal, or peritoneal cancers).  Pap test.** / Every 2 years from ages 12 through 51. Every 3 years starting at age 89 through age 13 or 59 with a history of 3 consecutive normal Pap tests.  HPV screening.** / Every 3 years from ages 71 through ages 37 to 74 with a history of 3 consecutive normal Pap tests.  Hepatitis C blood test.** / For any individual with known risks for hepatitis C.  Skin self-exam. / Monthly.  Influenza vaccine. / Every year.  Tetanus, diphtheria, and acellular pertussis (Tdap, Td) vaccine.** / Consult your health care provider. Pregnant women should receive 1 dose of Tdap vaccine during each pregnancy. 1 dose of Td every 10 years.  Varicella vaccine.** / Consult your health care provider. Pregnant females who do not have evidence of immunity should receive the first dose after pregnancy.  HPV vaccine. / 3 doses over 6 months, if 65 and younger. The vaccine is not recommended for use in pregnant females. However, pregnancy testing is not needed before receiving a dose.  Measles, mumps, rubella (MMR) vaccine.** / You need at least 1 dose of MMR if you were born in 1957 or later. You may also need a 2nd dose. For females of childbearing age, rubella immunity should be determined. If there is no evidence of immunity, females who are not pregnant should be vaccinated. If there is no evidence of immunity, females who are  pregnant should delay immunization until after pregnancy.  Pneumococcal  13-valent conjugate (PCV13) vaccine.** / Consult your health care provider.  Pneumococcal polysaccharide (PPSV23) vaccine.** / 1 to 2 doses if you smoke cigarettes or if you have certain conditions.  Meningococcal vaccine.** / 1 dose if you are age 68 to 8 years and a Market researcher living in a residence hall, or have one of several medical conditions, you need to get vaccinated against meningococcal disease. You may also need additional booster doses.  Hepatitis A vaccine.** / Consult your health care provider.  Hepatitis B vaccine.** / Consult your health care provider.  Haemophilus influenzae type b (Hib) vaccine.** / Consult your health care provider. Ages 7 to 53 years  Blood pressure check.** / Every year.  Lipid and cholesterol check.** / Every 5 years beginning at age 25 years.  Lung cancer screening. / Every year if you are aged 11-80 years and have a 30-pack-year history of smoking and currently smoke or have quit within the past 15 years. Yearly screening is stopped once you have quit smoking for at least 15 years or develop a health problem that would prevent you from having lung cancer treatment.  Clinical breast exam.** / Every year after age 48 years.  BRCA-related cancer risk assessment.** / For women who have family members with a BRCA-related cancer (breast, ovarian, tubal, or peritoneal cancers).  Mammogram.** / Every year beginning at age 41 years and continuing for as long as you are in good health. Consult with your health care provider.  Pap test.** / Every 3 years starting at age 65 years through age 37 or 70 years with a history of 3 consecutive normal Pap tests.  HPV screening.** / Every 3 years from ages 72 years through ages 60 to 40 years with a history of 3 consecutive normal Pap tests.  Fecal occult blood test (FOBT) of stool. / Every year beginning at age 21 years and continuing until age 5 years. You may not need to do this test if you get  a colonoscopy every 10 years.  Flexible sigmoidoscopy or colonoscopy.** / Every 5 years for a flexible sigmoidoscopy or every 10 years for a colonoscopy beginning at age 35 years and continuing until age 48 years.  Hepatitis C blood test.** / For all people born from [birth year] through 1964 and any individual with known risks for hepatitis C.  Skin self-exam. / Monthly.  Influenza vaccine. / Every year.  Tetanus, diphtheria, and acellular pertussis (Tdap/Td) vaccine.** / Consult your health care provider. Pregnant women should receive 1 dose of Tdap vaccine during each pregnancy. 1 dose of Td every 10 years.  Varicella vaccine.** / Consult your health care provider. Pregnant females who do not have evidence of immunity should receive the first dose after pregnancy.  Zoster vaccine.** / 1 dose for adults aged 30 years or older.  Measles, mumps, rubella (MMR) vaccine.** / You need at least 1 dose of MMR if you were born in 1957 or later. You may also need a second dose. For females of childbearing age, rubella immunity should be determined. If there is no evidence of immunity, females who are not pregnant should be vaccinated. If there is no evidence of immunity, females who are pregnant should delay immunization until after pregnancy.  Pneumococcal 13-valent conjugate (PCV13) vaccine.** / Consult your health care provider.  Pneumococcal polysaccharide (PPSV23) vaccine.** / 1 to 2 doses if you smoke cigarettes or if you have certain conditions.  Meningococcal vaccine.** /  Consult your health care provider.  Hepatitis A vaccine.** / Consult your health care provider.  Hepatitis B vaccine.** / Consult your health care provider.  Haemophilus influenzae type b (Hib) vaccine.** / Consult your health care provider. Ages 64 years and over  Blood pressure check.** / Every year.  Lipid and cholesterol check.** / Every 5 years beginning at age 23 years.  Lung cancer screening. / Every year if you  are aged 16-80 years and have a 30-pack-year history of smoking and currently smoke or have quit within the past 15 years. Yearly screening is stopped once you have quit smoking for at least 15 years or develop a health problem that would prevent you from having lung cancer treatment.  Clinical breast exam.** / Every year after age 74 years.  BRCA-related cancer risk assessment.** / For women who have family members with a BRCA-related cancer (breast, ovarian, tubal, or peritoneal cancers).  Mammogram.** / Every year beginning at age 44 years and continuing for as long as you are in good health. Consult with your health care provider.  Pap test.** / Every 3 years starting at age 58 years through age 22 or 39 years with 3 consecutive normal Pap tests. Testing can be stopped between 65 and 70 years with 3 consecutive normal Pap tests and no abnormal Pap or HPV tests in the past 10 years.  HPV screening.** / Every 3 years from ages 64 years through ages 70 or 61 years with a history of 3 consecutive normal Pap tests. Testing can be stopped between 65 and 70 years with 3 consecutive normal Pap tests and no abnormal Pap or HPV tests in the past 10 years.  Fecal occult blood test (FOBT) of stool. / Every year beginning at age 40 years and continuing until age 27 years. You may not need to do this test if you get a colonoscopy every 10 years.  Flexible sigmoidoscopy or colonoscopy.** / Every 5 years for a flexible sigmoidoscopy or every 10 years for a colonoscopy beginning at age 7 years and continuing until age 32 years.  Hepatitis C blood test.** / For all people born from [birth year] through 1964 and any individual with known risks for hepatitis C.  Osteoporosis screening.** / A one-time screening for women ages 30 years and over and women at risk for fractures or osteoporosis.  Skin self-exam. / Monthly.  Influenza vaccine. / Every year.  Tetanus, diphtheria, and acellular pertussis (Tdap/Td)  vaccine.** / 1 dose of Td every 10 years.  Varicella vaccine.** / Consult your health care provider.  Zoster vaccine.** / 1 dose for adults aged 35 years or older.  Pneumococcal 13-valent conjugate (PCV13) vaccine.** / Consult your health care provider.  Pneumococcal polysaccharide (PPSV23) vaccine.** / 1 dose for all adults aged 46 years and older.  Meningococcal vaccine.** / Consult your health care provider.  Hepatitis A vaccine.** / Consult your health care provider.  Hepatitis B vaccine.** / Consult your health care provider.  Haemophilus influenzae type b (Hib) vaccine.** / Consult your health care provider. ** Family history and personal history of risk and conditions may change your health care provider's recommendations.   This information is not intended to replace advice given to you by your health care provider. Make sure you discuss any questions you have with your health care provider.   Document Released: 03/13/2001 Document Revised: 02/05/2014 Document Reviewed: 06/12/2010 Elsevier Interactive Patient Education [employer].

## 2015-04-15 ENCOUNTER — Encounter: Payer: Self-pay | Admitting: Internal Medicine

## 2015-04-15 LAB — THYROID PEROXIDASE ANTIBODY: Thyroperoxidase Ab SerPl-aCnc: <1 IU/mL (ref <9)

## 2015-07-20 ENCOUNTER — Other Ambulatory Visit: Payer: Self-pay | Admitting: Internal Medicine

## 2015-08-30 ENCOUNTER — Other Ambulatory Visit: Payer: Self-pay | Admitting: Internal Medicine

## 2015-08-30 DIAGNOSIS — F900 Attention-deficit hyperactivity disorder, predominantly inattentive type: Secondary | ICD-10-CM

## 2015-08-30 DIAGNOSIS — F902 Attention-deficit hyperactivity disorder, combined type: Secondary | ICD-10-CM

## 2015-08-30 MED ORDER — AMPHETAMINE-DEXTROAMPHETAMINE 20 MG PO TABS: 20.0000 mg | ORAL_TABLET | Freq: Two times a day (BID) | ORAL | 0 refills | Status: DC

## 2015-08-30 NOTE — Telephone Encounter (Signed)
Place rx's up front for pick-up.../lmb 

## 2016-01-11 ENCOUNTER — Telehealth: Payer: Self-pay | Admitting: Internal Medicine

## 2016-01-11 NOTE — Telephone Encounter (Signed)
Pt asking to trans to [facility] Aquas from Dr Ronnald Ramp, [place] states that the [place] location is closer for her.

## 2016-01-11 NOTE — Telephone Encounter (Signed)
Pt had been scheduled

## 2016-01-11 NOTE — Telephone Encounter (Signed)
Ok with me 

## 2016-01-11 NOTE — Telephone Encounter (Signed)
yes

## 2016-01-17 ENCOUNTER — Encounter: Payer: Self-pay | Admitting: Physician Assistant

## 2016-01-17 ENCOUNTER — Ambulatory Visit (INDEPENDENT_AMBULATORY_CARE_PROVIDER_SITE_OTHER): Payer: 59 | Admitting: Physician Assistant

## 2016-01-17 VITALS — BP 112/68 | HR 92 | Temp 98.0°F | Resp 16 | Ht 66.0 in | Wt 173.0 lb

## 2016-01-17 DIAGNOSIS — F411 Generalized anxiety disorder: Secondary | ICD-10-CM | POA: Diagnosis not present

## 2016-01-17 DIAGNOSIS — F900 Attention-deficit hyperactivity disorder, predominantly inattentive type: Secondary | ICD-10-CM

## 2016-01-17 MED ORDER — ALPRAZOLAM 0.5 MG PO TABS: 0.5000 mg | ORAL_TABLET | Freq: Every day | ORAL | 0 refills | Status: DC

## 2016-01-17 MED ORDER — AMPHETAMINE-DEXTROAMPHET ER 30 MG PO CP24: 30.0000 mg | ORAL_CAPSULE | Freq: Every day | ORAL | 0 refills | Status: DC

## 2016-01-17 NOTE — Progress Notes (Signed)
Patient presents to clinic today as a transfer of care from Dr. Ronnald Ramp at our [place] office.  Chronic Issues: PTSD -- Patient with longstanding history of PTSD after witnessing the murder of her cousin at a young age. Struggled with anxiety/depression and nightmares throughout adolescence. Was followed by psychiatry at that time and on multiple medications. Did very well in early adulthood so was taken off of medication. Did very well until the birth of her children which caused a recurrence of nightmares. Was set up with Psychiatry and placed on Lamictal 100 mg daily. Endorses doing extremely well on this regimen. Was taken over by her previous PCP. Denies breakthrough symptoms. Denies SI/HI  ADHD -- Currently on Adderal 20 mg BID. Endorses doing well overall with this regimen previously. Is currently working long work hours and medication is not lasting. Would like to discuss longer acting options.   Health Maintenance: Immunizations -- Tetanus and flu shot up-to-date Mammogram -- + history of dense tissue of L breast. [facility]. Patient is overdue. Will call to schedule.  PAP -- up-to-date. Last 10/29/2013 - Normal.    Past Medical History:  Diagnosis Date  . Anxiety   . Depression    Dr. Rachel Moulds  . GERD (gastroesophageal reflux disease)     Past Surgical History:  Procedure Laterality Date  . TONSILLECTOMY AND ADENOIDECTOMY    . WISDOM TOOTH EXTRACTION      Current Outpatient Prescriptions on File Prior to Visit  Medication Sig Dispense Refill  . lamoTRIgine (LAMICTAL) 100 MG tablet TAKE 1 TABLET BY MOUTH EVERY DAY 90 tablet 1  . levonorgestrel (MIRENA) 20 MCG/24HR IUD 1 Intra Uterine Device (1 each total) by Intrauterine route once. 1 each 0  . cetirizine (ZYRTEC) 10 MG tablet Take 1 tablet (10 mg total) by mouth daily. (Patient not taking: Reported on 01/17/2016) 90 tablet 3   No current facility-administered medications on file prior  to visit.     Allergies  Allergen Reactions  . Codeine Nausea And Vomiting  . Depo-Medrol [Methylprednisolone Acetate] Hives    Family History  Problem Relation Age of Onset  . Lung cancer Father   . Hypertension Father   . Heart disease Father   . Alcohol abuse Neg Hx   . Diabetes Neg Hx   . Drug abuse Neg Hx   . Early death Neg Hx   . Hyperlipidemia Neg Hx   . Kidney disease Neg Hx   . Stroke Neg Hx     Social History   Social History  . Marital status: Married    Spouse name: N/A  . Number of children: 3  . Years of education: N/A   Occupational History  . 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 Smoking status: Former Smoker    Packs/day: 1.00    Years: 20.00    Types: Cigarettes    Quit date: 06/04/2012  . Smokeless tobacco: Never Used     Comment: e cigs periodically  . Alcohol use 1.2 oz/week    2 Shots of liquor per week     Comment: socially  . Drug use: No  . Sexual activity: Yes    Birth control/ protection: IUD   Other Topics Concern  . Not on file   Social History Narrative  . No narrative on file   Review of Systems  Constitutional: Negative for fever and malaise/fatigue.  Eyes: Negative  for blurred vision and double vision.  Respiratory: Negative for cough and shortness of breath.   Cardiovascular: Negative for chest pain and palpitations.  Neurological: Negative for dizziness and loss of consciousness.  Psychiatric/Behavioral: Negative for depression, hallucinations, substance abuse and suicidal ideas. The patient is not nervous/anxious and does not have insomnia.    BP 112/68   Pulse 92   Temp 98 F (36.7 C) (Oral)   Resp 16   Ht 5\' 6"  (1.676 m)   Wt 173 lb (78.5 kg)   SpO2 99%   BMI 27.92 kg/m   Physical Exam  Constitutional: She is oriented to person, place, and time and well-developed, well-nourished, and in no distress.  HENT:  Head: Normocephalic and atraumatic.  Eyes: Conjunctivae  are normal.  Neck: Neck supple. No thyromegaly present.  Cardiovascular: Normal rate, regular rhythm, normal heart sounds and intact distal pulses.   Pulmonary/Chest: Effort normal and breath sounds normal. No respiratory distress. She has no wheezes. She has no rales. She exhibits no tenderness.  Neurological: She is alert and oriented to person, place, and time.  Skin: Skin is warm and dry. No rash noted.  Psychiatric: Affect normal.  Vitals reviewed.  Assessment/Plan: GAD (generalized anxiety disorder) With PTSD. Has done very well with current regimen. Will continue. Medications refilled. Will check CMP and Lamictal level. FU scheduled.   ADHD (attention deficit hyperactivity disorder), inattentive type Will attempt trial of Adderal XR 30 mg daily. FU 1 month. Side effects discussed. She is to stop medication and call us if these occur.    Leeanne Rio, PA-C

## 2016-01-17 NOTE — Patient Instructions (Signed)
Please go to the lab for a lamictal level. I will call with results.  Do not forget to schedule a lab appointment with Levada Dy at the front for fasting labs for biometric screening.  Please continue chronic medications with the following exception: - Start the new Adderall XR as directed. - If the pharmacy needs prior authorization with the new medication, we will take care of this.   Follow-up in 4 weeks. Return sooner if needed.

## 2016-01-17 NOTE — Progress Notes (Signed)
Pre visit review using our clinic review tool, if applicable. No additional management support is needed unless otherwise documented below in the visit note. 

## 2016-01-18 LAB — COMPREHENSIVE METABOLIC PANEL
ALBUMIN: 4.6 g/dL (ref 3.5–5.2)
ALT: 10 U/L (ref 0–35)
AST: 12 U/L (ref 0–37)
Alkaline Phosphatase: 54 U/L (ref 39–117)
BUN: 7 mg/dL (ref 6–23)
CALCIUM: 9 mg/dL (ref 8.4–10.5)
CHLORIDE: 105 meq/L (ref 96–112)
CO2: 30 mEq/L (ref 19–32)
Creatinine, Ser: 0.67 mg/dL (ref 0.40–1.20)
GFR: 103.97 mL/min (ref >60.00)
Glucose, Bld: 90 mg/dL (ref 70–99)
POTASSIUM: 3.4 meq/L — AB (ref 3.5–5.1)
SODIUM: 141 meq/L (ref 135–145)
Total Bilirubin: 1.1 mg/dL (ref 0.2–1.2)
Total Protein: 6.8 g/dL (ref 6.0–8.3)

## 2016-01-18 NOTE — Assessment & Plan Note (Signed)
Will attempt trial of Adderal XR 30 mg daily. FU 1 month. Side effects discussed. She is to stop medication and call us if these occur.

## 2016-01-18 NOTE — Assessment & Plan Note (Addendum)
With PTSD. Has done very well with current regimen. Will continue. Medications refilled. Will check CMP and Lamictal level. FU scheduled. Discussed that if there is deterioration in symptoms, she would need referral back to psychiatry giving her complex history.

## 2016-01-19 LAB — LAMOTRIGINE LEVEL: Lamotrigine Lvl: 2.3 ug/mL — ABNORMAL LOW (ref 4.0–18.0)

## 2016-01-20 ENCOUNTER — Other Ambulatory Visit: Payer: Self-pay | Admitting: Physician Assistant

## 2016-01-20 DIAGNOSIS — Z299 Encounter for prophylactic measures, unspecified: Secondary | ICD-10-CM

## 2016-01-22 ENCOUNTER — Other Ambulatory Visit: Payer: Self-pay | Admitting: Physician Assistant

## 2016-01-24 ENCOUNTER — Other Ambulatory Visit (INDEPENDENT_AMBULATORY_CARE_PROVIDER_SITE_OTHER): Payer: 59

## 2016-01-24 ENCOUNTER — Telehealth: Payer: Self-pay | Admitting: Physician Assistant

## 2016-01-24 DIAGNOSIS — Z299 Encounter for prophylactic measures, unspecified: Secondary | ICD-10-CM

## 2016-01-24 DIAGNOSIS — F900 Attention-deficit hyperactivity disorder, predominantly inattentive type: Secondary | ICD-10-CM

## 2016-01-24 DIAGNOSIS — F902 Attention-deficit hyperactivity disorder, combined type: Secondary | ICD-10-CM

## 2016-01-24 LAB — URINALYSIS, ROUTINE W REFLEX MICROSCOPIC
BILIRUBIN URINE: NEGATIVE
KETONES UR: NEGATIVE
LEUKOCYTES UA: NEGATIVE
NITRITE: NEGATIVE
PH: 6 (ref 5.0–8.0)
SPECIFIC GRAVITY, URINE: 1.025 (ref 1.000–1.030)
TOTAL PROTEIN, URINE-UPE24: NEGATIVE
URINE GLUCOSE: NEGATIVE
UROBILINOGEN UA: 0.2 (ref 0.0–1.0)

## 2016-01-24 LAB — CBC
HCT: 36.5 % (ref 36.0–46.0)
HEMOGLOBIN: 12.4 g/dL (ref 12.0–15.0)
MCHC: 34.1 g/dL (ref 30.0–36.0)
MCV: 90.1 fl (ref 78.0–100.0)
Platelets: 208 10*3/uL (ref 150.0–400.0)
RBC: 4.05 Mil/uL (ref 3.87–5.11)
RDW: 13.6 % (ref 11.5–15.5)
WBC: 7.7 10*3/uL (ref 4.0–10.5)

## 2016-01-24 LAB — LIPID PANEL
CHOL/HDL RATIO: 3
Cholesterol: 122 mg/dL (ref 0–200)
HDL: 45.5 mg/dL (ref >39.00)
LDL Cholesterol: 69 mg/dL (ref 0–99)
NONHDL: 76.74
Triglycerides: 41 mg/dL (ref 0.0–149.0)
VLDL: 8.2 mg/dL (ref 0.0–40.0)

## 2016-01-24 LAB — HEMOGLOBIN A1C: HEMOGLOBIN A1C: 5.3 % (ref 4.6–6.5)

## 2016-01-24 LAB — VITAMIN D 25 HYDROXY (VIT D DEFICIENCY, FRACTURES): VITD: 13.22 ng/mL — ABNORMAL LOW (ref 30.00–100.00)

## 2016-01-24 LAB — TSH: TSH: 1.04 u[IU]/mL (ref 0.35–4.50)

## 2016-01-24 MED ORDER — AMPHETAMINE-DEXTROAMPHETAMINE 20 MG PO TABS: 20.0000 mg | ORAL_TABLET | Freq: Two times a day (BID) | ORAL | 0 refills | Status: DC

## 2016-01-24 NOTE — Telephone Encounter (Signed)
Please advise 

## 2016-01-24 NOTE — Telephone Encounter (Signed)
Med filled and pt made aware rx is available at the front desk for pick up.

## 2016-01-24 NOTE — Telephone Encounter (Signed)
Pt states that the ADDERALL XR would cost her $137.00 her part and asking if there is anything else that she could try. Pt is out of meds now. Pt is willing to go back to the two she was on until [place] gets back in office to try something different with ins.

## 2016-01-24 NOTE — Telephone Encounter (Signed)
Ok to go back to Adderall (short acting) 20mg  BID, #60 until [place] returns and they are able to discuss her options going forward

## 2016-01-25 ENCOUNTER — Other Ambulatory Visit: Payer: Self-pay | Admitting: General Practice

## 2016-01-25 MED ORDER — VITAMIN D (ERGOCALCIFEROL) 1.25 MG (50000 UNIT) PO CAPS: 50000.0000 [IU] | ORAL_CAPSULE | ORAL | 0 refills | Status: DC

## 2016-02-09 ENCOUNTER — Other Ambulatory Visit: Payer: Self-pay | Admitting: Internal Medicine

## 2016-02-14 ENCOUNTER — Other Ambulatory Visit: Payer: Self-pay | Admitting: Physician Assistant

## 2016-03-06 ENCOUNTER — Telehealth: Payer: Self-pay | Admitting: Physician Assistant

## 2016-03-06 ENCOUNTER — Other Ambulatory Visit: Payer: Self-pay | Admitting: Physician Assistant

## 2016-03-06 DIAGNOSIS — F900 Attention-deficit hyperactivity disorder, predominantly inattentive type: Secondary | ICD-10-CM

## 2016-03-06 DIAGNOSIS — F902 Attention-deficit hyperactivity disorder, combined type: Secondary | ICD-10-CM

## 2016-03-06 DIAGNOSIS — F411 Generalized anxiety disorder: Secondary | ICD-10-CM

## 2016-03-06 MED ORDER — AMPHETAMINE-DEXTROAMPHETAMINE 20 MG PO TABS: 20.0000 mg | ORAL_TABLET | Freq: Two times a day (BID) | ORAL | 0 refills | Status: DC

## 2016-03-06 NOTE — Telephone Encounter (Signed)
Rx ready up front for pick up. Need a f/u appt to discuss ADHD medication change. Patient is agreeable.

## 2016-03-06 NOTE — Telephone Encounter (Signed)
Adderall last filled 01/24/16 #60 Last ov: 01/17/16 Please advise

## 2016-03-06 NOTE — Telephone Encounter (Signed)
Pt needs a refill on adderall, pt states that is ran out today.

## 2016-03-06 NOTE — Telephone Encounter (Signed)
Ok to give refill. Needs FU in the next month.

## 2016-03-07 NOTE — Telephone Encounter (Signed)
Xanax last filled 01/17/16 #30 CSC signed 01/17/16 Please advise

## 2016-04-10 ENCOUNTER — Encounter: Payer: Self-pay | Admitting: Physician Assistant

## 2016-04-10 ENCOUNTER — Ambulatory Visit (INDEPENDENT_AMBULATORY_CARE_PROVIDER_SITE_OTHER): Payer: 59 | Admitting: Physician Assistant

## 2016-04-10 VITALS — BP 128/82 | HR 80 | Temp 97.8°F | Resp 14 | Ht 66.0 in | Wt 172.0 lb

## 2016-04-10 DIAGNOSIS — F900 Attention-deficit hyperactivity disorder, predominantly inattentive type: Secondary | ICD-10-CM | POA: Diagnosis not present

## 2016-04-10 MED ORDER — AMPHETAMINE-DEXTROAMPHETAMINE 20 MG PO TABS: 20.0000 mg | ORAL_TABLET | Freq: Two times a day (BID) | ORAL | 0 refills | Status: DC

## 2016-04-10 NOTE — Progress Notes (Signed)
Patient presents to clinic today to discuss alternative ADD medications. Is currently on Adderall 20 mg BID. Was wanting to try a longer-acting medication but insurance would not approve Adderall XR. States it was > 200.00 per month. Was previously on vyvanse and endorses doing very well. Had to switch to Adderall due to her old insurance not covering the Vyvanse any more.   Past Medical History:  Diagnosis Date  . Anxiety   . Depression    Dr. Rachel Moulds  . GERD (gastroesophageal reflux disease)     Current Outpatient Prescriptions on File Prior to Visit  Medication Sig Dispense Refill  . ALPRAZolam (XANAX) 0.5 MG tablet TAKE 1 TABLET BY MOUTH EVERY NIGHT AT BEDTIME 30 tablet 0  . amphetamine-dextroamphetamine (ADDERALL) 20 MG tablet Take 1 tablet (20 mg total) by mouth 2 (two) times daily. 60 tablet 0  . lamoTRIgine (LAMICTAL) 100 MG tablet TAKE 1 TABLET BY MOUTH EVERY DAY 90 tablet 0  . levonorgestrel (MIRENA) 20 MCG/24HR IUD 1 Intra Uterine Device (1 each total) by Intrauterine route once. 1 each 0   No current facility-administered medications on file prior to visit.     Allergies  Allergen Reactions  . Codeine Nausea And Vomiting  . Depo-Medrol [Methylprednisolone Acetate] Hives    Family History  Problem Relation Age of Onset  . Lung cancer Father   . Hypertension Father   . Heart disease Father   . Alcohol abuse Neg Hx   . Diabetes Neg Hx   . Drug abuse Neg Hx   . Early death Neg Hx   . Hyperlipidemia Neg Hx   . Kidney disease Neg Hx   . Stroke Neg Hx     Social History   Social History  . Marital status: Married    Spouse name: N/A  . Number of children: 3  . Years of education: N/A   Occupational History  . 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 Smoking status: Former Smoker    Packs/day: 1.00    Years: 20.00    Types: Cigarettes    Quit date: 06/04/2012  . Smokeless tobacco: Never Used     Comment: e  cigs periodically  . Alcohol use 1.2 oz/week    2 Shots of liquor per week     Comment: socially  . Drug use: No  . Sexual activity: Yes    Birth control/ protection: IUD   Other Topics Concern  . None   Social History Narrative  . None    Review of Systems - See HPI.  All other ROS are negative.  BP 128/82   Pulse 80   Temp 97.8 F (36.6 C) (Oral)   Resp 14   Ht 5' 6"  (1.676 m)   Wt 172 lb (78 kg)   SpO2 99%   BMI 27.76 kg/m   Physical Exam  Constitutional: She is well-developed, well-nourished, and in no distress.  HENT:  Head: Normocephalic and atraumatic.  Cardiovascular: Normal rate, regular rhythm, normal heart sounds and intact distal pulses.   Pulmonary/Chest: Effort normal and breath sounds normal. No respiratory distress. She has no wheezes. She has no rales. She exhibits no tenderness.  Neurological: She is alert.  Psychiatric: Affect normal.  Vitals reviewed.   Recent Results (from the past 2160 hour(s))  Lamotrigine level     Status: Abnormal   Collection Time: 01/17/16  4:01 PM  Result Value Ref Range  Lamotrigine Lvl 2.3 (L) 4.0 - 18.0 mcg/mL    Comment: This test was developed and its analytical performance characteristics have been determined by [place], [place]. It has not been cleared or approved by the U.S. Food and Drug Administration. This assay has been validated pursuant to the CLIA regulations and is used for clinical purposes.   Comp Met (CMET)     Status: Abnormal   Collection Time: 01/17/16  4:01 PM  Result Value Ref Range   Sodium 141 135 - 145 mEq/L   Potassium 3.4 (L) 3.5 - 5.1 mEq/L   Chloride 105 96 - 112 mEq/L   CO2 30 19 - 32 mEq/L   Glucose, Bld 90 70 - 99 mg/dL   BUN 7 6 - 23 mg/dL   Creatinine, Ser 0.67 0.40 - 1.20 mg/dL   Total Bilirubin 1.1 0.2 - 1.2 mg/dL   Alkaline Phosphatase 54 39 - 117 U/L   AST 12 0 - 37 U/L   ALT 10 0 - 35 U/L   Total Protein 6.8 6.0 - 8.3 g/dL   Albumin  4.6 3.5 - 5.2 g/dL   Calcium 9.0 8.4 - 10.5 mg/dL   GFR 103.97 >60.00 mL/min  CBC     Status: None   Collection Time: 01/24/16  9:02 AM  Result Value Ref Range   WBC 7.7 4.0 - 10.5 K/uL   RBC 4.05 3.87 - 5.11 Mil/uL   Platelets 208.0 150.0 - 400.0 K/uL   Hemoglobin 12.4 12.0 - 15.0 g/dL   HCT 36.5 36.0 - 46.0 %   MCV 90.1 78.0 - 100.0 fl   MCHC 34.1 30.0 - 36.0 g/dL   RDW 13.6 11.5 - 15.5 %  Urinalysis, Routine w reflex microscopic     Status: Abnormal   Collection Time: 01/24/16  9:02 AM  Result Value Ref Range   Color, Urine YELLOW Yellow;Lt. Yellow   APPearance Sl Cloudy (A) Clear   Specific Gravity, Urine 1.025 1.000 - 1.030   pH 6.0 5.0 - 8.0   Total Protein, Urine NEGATIVE Negative   Urine Glucose NEGATIVE Negative   Ketones, ur NEGATIVE Negative   Bilirubin Urine NEGATIVE Negative   Hgb urine dipstick TRACE-INTACT (A) Negative   Urobilinogen, UA 0.2 0.0 - 1.0   Leukocytes, UA NEGATIVE Negative   Nitrite NEGATIVE Negative   WBC, UA 0-2/hpf 0-2/hpf   RBC / HPF 0-2/hpf 0-2/hpf   Squamous Epithelial / LPF Few(5-10/hpf) (A) Rare(0-4/hpf)   Bacteria, UA Rare(<10/hpf) (A) None  Hemoglobin A1c     Status: None   Collection Time: 01/24/16  9:02 AM  Result Value Ref Range   Hgb A1c MFr Bld 5.3 4.6 - 6.5 %    Comment: Glycemic Control Guidelines for People with Diabetes:Non Diabetic:  <6%Goal of Therapy: <7%Additional Action Suggested:  >8%   Lipid panel     Status: None   Collection Time: 01/24/16  9:02 AM  Result Value Ref Range   Cholesterol 122 0 - 200 mg/dL    Comment: ATP III Classification       Desirable:  < 200 mg/dL               Borderline High:  200 - 239 mg/dL          High:  > = 240 mg/dL   Triglycerides 41.0 0.0 - 149.0 mg/dL    Comment: Normal:  <150 mg/dLBorderline High:  150 - 199 mg/dL   HDL 45.50 >39.00 mg/dL  VLDL 8.2 0.0 - 40.0 mg/dL   LDL Cholesterol 69 0 - 99 mg/dL   Total CHOL/HDL Ratio 3     Comment:                Men          Women1/2 Average  Risk     3.4          3.3Average Risk          5.0          4.42X Average Risk          9.6          7.13X Average Risk          15.0          11.0                       NonHDL 76.74     Comment: NOTE:  Non-HDL goal should be 30 mg/dL higher than patient's LDL goal (i.e. LDL goal of < 70 mg/dL, would have non-HDL goal of < 100 mg/dL)  Vitamin D (25 hydroxy)     Status: Abnormal   Collection Time: 01/24/16  9:02 AM  Result Value Ref Range   VITD 13.22 (L) 30.00 - 100.00 ng/mL  TSH     Status: None   Collection Time: 01/24/16  9:02 AM  Result Value Ref Range   TSH 1.04 0.35 - 4.50 uIU/mL   Assessment/Plan: 1. ADHD (attention deficit hyperactivity disorder), inattentive type Discussed alternative medications. She will check coverage with her insurance so we can proceed with new Rx. Refill of current medication given.  - amphetamine-dextroamphetamine (ADDERALL) 20 MG tablet; Take 1 tablet (20 mg total) by mouth 2 (two) times daily.  Dispense: 60 tablet;   Leeanne Rio, PA-C

## 2016-04-10 NOTE — Progress Notes (Signed)
Pre visit review using our clinic review tool, if applicable. No additional management support is needed unless otherwise documented below in the visit note. 

## 2016-04-10 NOTE — Patient Instructions (Signed)
Check with your insurance about coverage for alternative ADD medications since they will not cover extended release Adderall.  Some options for you include:  - Vyvanse  - Strattera   - Concerta   They should cover one of these if they will not cover the Adderall. Please call me and let me know what options they give you.

## 2016-05-17 ENCOUNTER — Other Ambulatory Visit: Payer: Self-pay | Admitting: Physician Assistant

## 2016-05-23 ENCOUNTER — Other Ambulatory Visit: Payer: Self-pay | Admitting: Physician Assistant

## 2016-05-23 DIAGNOSIS — F900 Attention-deficit hyperactivity disorder, predominantly inattentive type: Secondary | ICD-10-CM

## 2016-05-23 NOTE — Telephone Encounter (Signed)
Pt needs refill on adderall °

## 2016-05-23 NOTE — Telephone Encounter (Signed)
Adderall last refilled 04/10/16 # 45 0RF CSC: 01/17/16  UDS: None Last ov: 04/10/16

## 2016-05-23 NOTE — Telephone Encounter (Signed)
Ok to give one refill of medication. Must give UDS at pickup.

## 2016-05-24 MED ORDER — AMPHETAMINE-DEXTROAMPHETAMINE 20 MG PO TABS: 20.0000 mg | ORAL_TABLET | Freq: Two times a day (BID) | ORAL | 0 refills | Status: DC

## 2016-05-24 NOTE — Telephone Encounter (Signed)
LMOVM advising patient the rx is ready for pick up and need to give a UDS when picking up

## 2016-05-30 ENCOUNTER — Encounter: Payer: Self-pay | Admitting: Physician Assistant

## 2016-05-30 ENCOUNTER — Ambulatory Visit (INDEPENDENT_AMBULATORY_CARE_PROVIDER_SITE_OTHER): Payer: 59 | Admitting: Physician Assistant

## 2016-05-30 ENCOUNTER — Other Ambulatory Visit: Payer: Self-pay | Admitting: Physician Assistant

## 2016-05-30 VITALS — BP 120/80 | HR 76 | Temp 98.0°F | Resp 14 | Ht 66.0 in | Wt 172.0 lb

## 2016-05-30 DIAGNOSIS — J0110 Acute frontal sinusitis, unspecified: Secondary | ICD-10-CM | POA: Diagnosis not present

## 2016-05-30 MED ORDER — DOXYCYCLINE HYCLATE 100 MG PO CAPS: 100.0000 mg | ORAL_CAPSULE | Freq: Two times a day (BID) | ORAL | 0 refills | Status: DC

## 2016-05-30 MED ORDER — FLUTICASONE PROPIONATE 50 MCG/ACT NA SUSP: 2.0000 | Freq: Every day | NASAL | 1 refills | Status: DC

## 2016-05-30 NOTE — Patient Instructions (Signed)
Increase fluid intake.  Use Saline nasal spray.  Take a daily multivitamin. Start the [facility] as directed.  Place a humidifier in the bedroom.  If this is not improving in 48 hours, start the antibiotic and take as directed.   Sinusitis Sinusitis is redness, soreness, and swelling (inflammation) of the paranasal sinuses. Paranasal sinuses are air pockets within the bones of your face (beneath the eyes, the middle of the forehead, or above the eyes). In healthy paranasal sinuses, mucus is able to drain out, and air is able to circulate through them by way of your nose. However, when your paranasal sinuses are inflamed, mucus and air can become trapped. This can allow bacteria and other germs to grow and cause infection. Sinusitis can develop quickly and last only a short time (acute) or continue over a long period (chronic). Sinusitis that lasts for more than 12 weeks is considered chronic.  CAUSES  Causes of sinusitis include:  Allergies.  Structural abnormalities, such as displacement of the cartilage that separates your nostrils (deviated septum), which can decrease the air flow through your nose and sinuses and affect sinus drainage.  Functional abnormalities, such as when the small hairs (cilia) that line your sinuses and help remove mucus do not work properly or are not present. SYMPTOMS  Symptoms of acute and chronic sinusitis are the same. The primary symptoms are pain and pressure around the affected sinuses. Other symptoms include:  Upper toothache.  Earache.  Headache.  Bad breath.  Decreased sense of smell and taste.  A cough, which worsens when you are lying flat.  Fatigue.  Fever.  Thick drainage from your nose, which often is green and may contain pus (purulent).  Swelling and warmth over the affected sinuses. DIAGNOSIS  Your caregiver will perform a physical exam. During the exam, your caregiver may:  Look in your nose for signs of abnormal growths in your  nostrils (nasal polyps).  Tap over the affected sinus to check for signs of infection.  View the inside of your sinuses (endoscopy) with a special imaging device with a light attached (endoscope), which is inserted into your sinuses. If your caregiver suspects that you have chronic sinusitis, one or more of the following tests may be recommended:  Allergy tests.  Nasal culture A sample of mucus is taken from your nose and sent to a lab and screened for bacteria.  Nasal cytology A sample of mucus is taken from your nose and examined by your caregiver to determine if your sinusitis is related to an allergy. TREATMENT  Most cases of acute sinusitis are related to a viral infection and will resolve on their own within 10 days. Sometimes medicines are prescribed to help relieve symptoms (pain medicine, decongestants, nasal steroid sprays, or saline sprays).  However, for sinusitis related to a bacterial infection, your caregiver will prescribe antibiotic medicines. These are medicines that will help kill the bacteria causing the infection.  Rarely, sinusitis is caused by a fungal infection. In theses cases, your caregiver will prescribe antifungal medicine. For some cases of chronic sinusitis, surgery is needed. Generally, these are cases in which sinusitis recurs more than 3 times per year, despite other treatments. HOME CARE INSTRUCTIONS   Drink plenty of water. Water helps thin the mucus so your sinuses can drain more easily.  Use a humidifier.  Inhale steam 3 to 4 times a day (for example, sit in the bathroom with the shower running).  Apply a warm, moist washcloth to your face 3  to 4 times a day, or as directed by your caregiver.  Use saline nasal sprays to help moisten and clean your sinuses.  Take over-the-counter or prescription medicines for pain, discomfort, or fever only as directed by your caregiver. SEEK IMMEDIATE MEDICAL CARE IF:  You have increasing pain or severe  headaches.  You have nausea, vomiting, or drowsiness.  You have swelling around your face.  You have vision problems.  You have a stiff neck.  You have difficulty breathing. MAKE SURE YOU:   Understand these instructions.  Will watch your condition.  Will get help right away if you are not doing well or get worse. Document Released: 01/15/2005 Document Revised: 04/09/2011 Document Reviewed: 01/30/2011 [facility] Patient Information 2014 [place], [place].

## 2016-05-30 NOTE — Progress Notes (Signed)
Pre visit review using our clinic review tool, if applicable. No additional management support is needed unless otherwise documented below in the visit note. 

## 2016-05-30 NOTE — Progress Notes (Signed)
Patient presents to clinic today c/o 1 week of scratchy throat, cough that is productive of yellow sputum, worse in the mornings. Does note some intermittent throbbing pain if R ear. Denies fever, chills. Denies sob or chest pain. Denies recent travel or sick contact. .  Past Medical History:  Diagnosis Date  . Anxiety   . Depression    Dr. Rachel Moulds  . GERD (gastroesophageal reflux disease)     Current Outpatient Prescriptions on File Prior to Visit  Medication Sig Dispense Refill  . ALPRAZolam (XANAX) 0.5 MG tablet TAKE 1 TABLET BY MOUTH EVERY NIGHT AT BEDTIME 30 tablet 0  . amphetamine-dextroamphetamine (ADDERALL) 20 MG tablet Take 1 tablet (20 mg total) by mouth 2 (two) times daily. 60 tablet 0  . lamoTRIgine (LAMICTAL) 100 MG tablet TAKE 1 TABLET BY MOUTH EVERY DAY 90 tablet 0  . levonorgestrel (MIRENA) 20 MCG/24HR IUD 1 Intra Uterine Device (1 each total) by Intrauterine route once. 1 each 0   No current facility-administered medications on file prior to visit.     Allergies  Allergen Reactions  . Codeine Nausea And Vomiting  . Depo-Medrol [Methylprednisolone Acetate] Hives    Family History  Problem Relation Age of Onset  . Lung cancer Father   . Hypertension Father   . Heart disease Father   . Alcohol abuse Neg Hx   . Diabetes Neg Hx   . Drug abuse Neg Hx   . Early death Neg Hx   . Hyperlipidemia Neg Hx   . Kidney disease Neg Hx   . Stroke Neg Hx     Social History   Social History  . Marital status: Married    Spouse name: N/A  . Number of children: 3  . Years of education: N/A   Occupational History  . 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 Smoking status: Former Smoker    Packs/day: 1.00    Years: 20.00    Types: Cigarettes    Quit date: 06/04/2012  . Smokeless tobacco: Never Used     Comment: e cigs periodically  . Alcohol use 1.2 oz/week    2 Shots of liquor per week     Comment: socially  . Drug  use: No  . Sexual activity: Yes    Birth control/ protection: IUD   Other Topics Concern  . None   Social History Narrative  . None   Review of Systems - See HPI.  All other ROS are negative.  BP 120/80   Pulse 76   Temp 98 F (36.7 C) (Oral)   Resp 14   Ht 5\' 6"  (1.676 m)   Wt 172 lb (78 kg)   SpO2 99%   BMI 27.76 kg/m   Physical Exam  Constitutional: She is well-developed, well-nourished, and in no distress.  HENT:  Head: Normocephalic and atraumatic.  Right Ear: External ear normal.  Left Ear: External ear normal.  Nose: Mucosal edema and rhinorrhea present. Right sinus exhibits frontal sinus tenderness.  Mouth/Throat: Uvula is midline and mucous membranes are normal. No oropharyngeal exudate.  Eyes: Conjunctivae are normal. Pupils are equal, round, and reactive to light.  Neck: Neck supple.  Cardiovascular: Normal rate, regular rhythm, normal heart sounds and intact distal pulses.   Pulmonary/Chest: Breath sounds normal. No respiratory distress. She has no wheezes. She has no rales. She exhibits no tenderness.  Lymphadenopathy:    She has no cervical adenopathy.  Vitals  reviewed.  Assessment/Plan: 1. Acute non-recurrent frontal sinusitis Still potentially viral but there is some concern of bacterial sinusitis at this point giving TTP of sinuses. Reviewed supportive measures and OTC medications with patient. She is to start these and Flonase. Printed Rx Doxycycline to start if symptoms are worsening over the next 24-48 hours. Take as directed.    Leeanne Rio, PA-C

## 2016-07-21 ENCOUNTER — Other Ambulatory Visit: Payer: Self-pay | Admitting: Physician Assistant

## 2016-07-21 DIAGNOSIS — F900 Attention-deficit hyperactivity disorder, predominantly inattentive type: Secondary | ICD-10-CM

## 2016-07-23 NOTE — Telephone Encounter (Signed)
Adderall Last rx 05/24/16 #60  Last OV: 04/10/16 CSC: 01/17/16  Please advise of refill

## 2016-07-24 MED ORDER — AMPHETAMINE-DEXTROAMPHETAMINE 20 MG PO TABS: 20.0000 mg | ORAL_TABLET | Freq: Two times a day (BID) | ORAL | 0 refills | Status: DC

## 2016-07-24 NOTE — Telephone Encounter (Signed)
Rx printed. Will need to give UDS at time of pickup.

## 2016-07-26 ENCOUNTER — Telehealth: Payer: Self-pay | Admitting: Physician Assistant

## 2016-07-26 NOTE — Telephone Encounter (Signed)
Pt dropped insurance form off, please fax when completed, placed in bin with charge sheet.

## 2016-07-27 NOTE — Telephone Encounter (Signed)
I have completed her forms. She did not sign her portion of the paperwork. She will need to drop by to sign so we can fax in for her. Forms are in file cabinet at front desk.   No charge for form completion.

## 2016-07-27 NOTE — Telephone Encounter (Signed)
LM making pt aware

## 2016-08-22 ENCOUNTER — Other Ambulatory Visit: Payer: Self-pay | Admitting: Physician Assistant

## 2016-08-24 NOTE — Telephone Encounter (Signed)
Left 2nd message regarding forms needing pt to sign before we can fax them.

## 2016-08-27 ENCOUNTER — Other Ambulatory Visit: Payer: Self-pay | Admitting: Physician Assistant

## 2016-08-27 DIAGNOSIS — F900 Attention-deficit hyperactivity disorder, predominantly inattentive type: Secondary | ICD-10-CM

## 2016-08-27 DIAGNOSIS — F411 Generalized anxiety disorder: Secondary | ICD-10-CM

## 2016-08-27 NOTE — Telephone Encounter (Signed)
Xanax last rx 03/07/16 #30  Adderall last rx 07/24/16 #60  Last OV: 04/10/16 for ADHD follow up [place]: 01/17/16 No UDS  Please advise. Patient would like to pick up tomorrow

## 2016-08-28 ENCOUNTER — Encounter: Payer: Self-pay | Admitting: Physician Assistant

## 2016-08-28 MED ORDER — AMPHETAMINE-DEXTROAMPHETAMINE 20 MG PO TABS: 20.0000 mg | ORAL_TABLET | Freq: Two times a day (BID) | ORAL | 0 refills | Status: DC

## 2016-08-28 MED ORDER — ALPRAZOLAM 0.5 MG PO TABS: 0.5000 mg | ORAL_TABLET | Freq: Every day | ORAL | 0 refills | Status: DC

## 2016-08-28 NOTE — Telephone Encounter (Signed)
Ok to print 1 month Rx for each. She will have to pick up and give UDS sample prior to pickup.

## 2016-08-28 NOTE — Telephone Encounter (Signed)
Rx printed and provider signed.  LMOVM advising patient rx is ready for pick up at the front desk. She is to leave a UDS when picking up rx.

## 2016-10-22 ENCOUNTER — Other Ambulatory Visit: Payer: Self-pay | Admitting: Physician Assistant

## 2016-10-22 DIAGNOSIS — F900 Attention-deficit hyperactivity disorder, predominantly inattentive type: Secondary | ICD-10-CM

## 2016-10-22 NOTE — Telephone Encounter (Signed)
Pt asking for a refill on adderall, pt asking if she could get this for two months due to distance to our office.

## 2016-10-22 NOTE — Telephone Encounter (Signed)
Last Adderall rx for 08/28/16 #60 CSC: 01/17/16 UDS:08/28/16 moderate risk Patient would like 2 months at a time.

## 2016-10-22 NOTE — Addendum Note (Signed)
Addended by: Leonidas Romberg on: 10/22/2016 02:42 PM   Modules accepted: Orders

## 2016-10-23 ENCOUNTER — Encounter: Payer: Self-pay | Admitting: Emergency Medicine

## 2016-10-23 MED ORDER — AMPHETAMINE-DEXTROAMPHETAMINE 20 MG PO TABS: 20.0000 mg | ORAL_TABLET | Freq: Two times a day (BID) | ORAL | 0 refills | Status: DC

## 2016-10-23 NOTE — Telephone Encounter (Signed)
Will allow 2 months of medication.  Due for follow-up in December. Rx printed and ready to pick up.

## 2016-10-23 NOTE — Addendum Note (Signed)
Addended by: Brunetta Jeans on: 10/23/2016 09:28 AM   Modules accepted: Orders

## 2016-10-23 NOTE — Telephone Encounter (Signed)
My chart message sent to patient to notify of rx ready for pick up.

## 2016-11-09 ENCOUNTER — Other Ambulatory Visit: Payer: Self-pay | Admitting: Physician Assistant

## 2016-11-09 ENCOUNTER — Encounter: Payer: Self-pay | Admitting: Emergency Medicine

## 2017-01-18 ENCOUNTER — Telehealth: Payer: Self-pay | Admitting: *Deleted

## 2017-01-18 ENCOUNTER — Other Ambulatory Visit: Payer: Self-pay | Admitting: Physician Assistant

## 2017-01-18 ENCOUNTER — Telehealth: Payer: Self-pay | Admitting: Physician Assistant

## 2017-01-18 DIAGNOSIS — F411 Generalized anxiety disorder: Secondary | ICD-10-CM

## 2017-01-18 DIAGNOSIS — F900 Attention-deficit hyperactivity disorder, predominantly inattentive type: Secondary | ICD-10-CM

## 2017-01-18 MED ORDER — ALPRAZOLAM 0.5 MG PO TABS: 0.5000 mg | ORAL_TABLET | Freq: Every day | ORAL | 0 refills | Status: DC

## 2017-01-18 MED ORDER — AMPHETAMINE-DEXTROAMPHETAMINE 20 MG PO TABS: 20.0000 mg | ORAL_TABLET | Freq: Two times a day (BID) | ORAL | 0 refills | Status: DC

## 2017-01-18 NOTE — Telephone Encounter (Signed)
LM for patient to let her know that medication was sent to pharmacy.  Also advised that she needed to call to schedule her CPE so that she can continue to get her medications.   CRM created letting the PEC know to schedule appt with patient.

## 2017-01-18 NOTE — Telephone Encounter (Signed)
Patient states that at her last refill she was given two scripts and her last refill of Adderall was in November.   She states that she was trying to get to the first of the year for an appointment for finance reasons.  She states that she will run out of medication before then and is asking if she can get 30 days worth of medicine, and then come in the first week of January.   Routed to PCP to advise.

## 2017-01-18 NOTE — Telephone Encounter (Signed)
Spoke with provider. [facility] has been contacted to cancel the RX.  PCP has sent in medications to correct pharmacy. Patient notified.  Copied from [place] [id number]. Topic: Quick Communication - See Telephone Encounter >> Jan 18, 2017  1:22 PM Hewitt Shorts wrote: CRM for notification. See Telephone encounter for: pt states that she did get the message that the adderall was called in and that she also got message about appt and while she was getting the message [facility] stating that  The rx was ready -pt realized that it should have been sent [facility] to [facility] [place] -if [facility] can [facility] be [facility]  Best number [phone] 01/18/17.

## 2017-01-18 NOTE — Addendum Note (Signed)
Addended by: Brunetta Jeans on: 01/18/2017 12:02 PM   Modules accepted: Orders

## 2017-01-18 NOTE — Telephone Encounter (Unsigned)
Copied from [place] [id number]. Topic: Quick Communication - See Telephone Encounter >> Jan 18, 2017  1:22 PM Hewitt Shorts wrote: CRM for notification. See Telephone encounter for: pt states that she did get the message that the adderall was called in and that she also got message about appt and while she was getting the message [facility] that  The rx was ready -pt realized that it should have been sent [facility] to [facility] [place] -if [facility] can [facility] be [facility]  Best number [phone] 01/18/17.

## 2017-01-18 NOTE — Telephone Encounter (Signed)
Will allow. Rx refills sent electronically to local pharmacy using [place].

## 2017-01-18 NOTE — Telephone Encounter (Signed)
Copied from [place] [phone]. Topic: Quick Communication - Rx Refill/Question >> Jan 18, 2017 10:53 AM Synthia Innocent wrote: Has the patient contacted their pharmacy? No. Controlled substance   (Agent: If no, request that the patient contact the pharmacy for the refill.)   Preferred Pharmacy (with phone number or street name): [facility]   Agent: Please be advised that RX refills may take up to 3 business days. We ask that you follow-up with your pharmacy. Requesting refill on amphetamine-dextroamphetamine (ADDERALL) 20 MG tablet and xanax 0.5mg 

## 2017-02-24 ENCOUNTER — Other Ambulatory Visit: Payer: Self-pay | Admitting: Physician Assistant

## 2017-02-25 ENCOUNTER — Encounter: Payer: Self-pay | Admitting: Physician Assistant

## 2017-02-25 ENCOUNTER — Ambulatory Visit (INDEPENDENT_AMBULATORY_CARE_PROVIDER_SITE_OTHER): Payer: 59 | Admitting: Physician Assistant

## 2017-02-25 ENCOUNTER — Other Ambulatory Visit: Payer: Self-pay

## 2017-02-25 ENCOUNTER — Other Ambulatory Visit: Payer: Self-pay | Admitting: Physician Assistant

## 2017-02-25 VITALS — BP 120/80 | HR 76 | Temp 98.0°F | Resp 14 | Ht 66.0 in | Wt 181.0 lb

## 2017-02-25 DIAGNOSIS — F900 Attention-deficit hyperactivity disorder, predominantly inattentive type: Secondary | ICD-10-CM | POA: Diagnosis not present

## 2017-02-25 DIAGNOSIS — F411 Generalized anxiety disorder: Secondary | ICD-10-CM

## 2017-02-25 DIAGNOSIS — E661 Drug-induced obesity: Secondary | ICD-10-CM | POA: Insufficient documentation

## 2017-02-25 LAB — CBC WITH DIFFERENTIAL/PLATELET
BASOS ABS: 0 10*3/uL (ref 0.0–0.1)
Basophils Relative: 0.6 % (ref 0.0–3.0)
Eosinophils Absolute: 0.1 10*3/uL (ref 0.0–0.7)
Eosinophils Relative: 1.9 % (ref 0.0–5.0)
HEMATOCRIT: 40.7 % (ref 36.0–46.0)
HEMOGLOBIN: 13.7 g/dL (ref 12.0–15.0)
LYMPHS PCT: 40.7 % (ref 12.0–46.0)
Lymphs Abs: 3.2 10*3/uL (ref 0.7–4.0)
MCHC: 33.7 g/dL (ref 30.0–36.0)
MCV: 91.5 fl (ref 78.0–100.0)
MONO ABS: 0.3 10*3/uL (ref 0.1–1.0)
Monocytes Relative: 4.3 % (ref 3.0–12.0)
Neutro Abs: 4.1 10*3/uL (ref 1.4–7.7)
Neutrophils Relative %: 52.5 % (ref 43.0–77.0)
Platelets: 229 10*3/uL (ref 150.0–400.0)
RBC: 4.45 Mil/uL (ref 3.87–5.11)
RDW: 14 % (ref 11.5–15.5)
WBC: 7.8 10*3/uL (ref 4.0–10.5)

## 2017-02-25 LAB — TSH: TSH: 3.15 u[IU]/mL (ref 0.35–4.50)

## 2017-02-25 MED ORDER — AMPHETAMINE-DEXTROAMPHETAMINE 20 MG PO TABS: 20.0000 mg | ORAL_TABLET | Freq: Two times a day (BID) | ORAL | 0 refills | Status: DC

## 2017-02-25 MED ORDER — LAMOTRIGINE 50 MG PO TBDP: 50.0000 mg | ORAL_TABLET | Freq: Every day | ORAL | 0 refills | Status: DC

## 2017-02-25 MED ORDER — VORTIOXETINE HBR 10 MG PO TABS: 10.0000 mg | ORAL_TABLET | Freq: Every day | ORAL | 0 refills | Status: DC

## 2017-02-25 NOTE — Telephone Encounter (Signed)
Patient has appointment today will refill then

## 2017-02-25 NOTE — Assessment & Plan Note (Signed)
Will cut back on Lamictal to 50 mg daily. Start low-dose Trintellix at 10 mg. Follow-up in 2 weeks.

## 2017-02-25 NOTE — Telephone Encounter (Signed)
rx refilled today at [place]. Medication has been decreased

## 2017-02-25 NOTE — Patient Instructions (Signed)
Please go to the lab today for blood work.  I will call you with your results. We will alter treatment regimen(s) if indicated by your results.   Please stay well-hydrated and get plenty of rest.  Cut down to the 50 mg dose of Lamictal. Start the Trintellix 10 mg tablet daily. We will do this for 2 weeks.  Follow-up with me at that time. If doing well, we will make further changes to discontinue the Lamictal.  I am sending in refills of your Adderall to the pharmacy.

## 2017-02-25 NOTE — Assessment & Plan Note (Signed)
Doing very well. Will continue current regimen. CSC updated. UDS today.

## 2017-02-25 NOTE — Progress Notes (Signed)
Patient presents to clinic today for follow-up of anxiety, depression and ADD.   Patient is currently on a regimen of Adderall 20 mg BID. Is taking as directed. Is sleeping well with maintaining a good appetite. Takes on work days only Denies side effects of medication. Patient due for UDS and updated CSC today.   In terms of anxiety and depression, patient is currently on Lamictal 100 mg daily. Has been on this regimen for quite some time and has done very well. Patient endorses mood is doing well. Denies SI/HI. Has noted significant weight gain since being put on the Lamictal several years ago. Would like to discuss medication options Is due for monitoring labs today.   Past Medical History:  Diagnosis Date  . Anxiety   . Depression    Dr. Rachel Moulds  . GERD (gastroesophageal reflux disease)     Current Outpatient Medications on File Prior to Visit  Medication Sig Dispense Refill  . ALPRAZolam (XANAX) 0.5 MG tablet Take 1 tablet (0.5 mg total) by mouth at bedtime. 30 tablet 0  . amphetamine-dextroamphetamine (ADDERALL) 20 MG tablet Take 1 tablet (20 mg total) by mouth 2 (two) times daily. 60 tablet 0  . levonorgestrel (MIRENA) 20 MCG/24HR IUD 1 Intra Uterine Device (1 each total) by Intrauterine route once. 1 each 0   No current facility-administered medications on file prior to visit.     Allergies  Allergen Reactions  . Codeine Nausea And Vomiting  . Depo-Medrol [Methylprednisolone Acetate] Hives    Family History  Problem Relation Age of Onset  . Lung cancer Father   . Hypertension Father   . Heart disease Father   . Alcohol abuse Neg Hx   . Diabetes Neg Hx   . Drug abuse Neg Hx   . Early death Neg Hx   . Hyperlipidemia Neg Hx   . Kidney disease Neg Hx   . Stroke Neg Hx     Social History   Socioeconomic History  . Marital status: Married    Spouse name: None  . Number of children: 3  . Years of education: None  . Highest education level: None  Social  Needs  . Financial resource strain: None  . Food insecurity - worry: None  . Food insecurity - inability: None  . Transportation needs - medical: None  . Transportation needs - non-medical: None  Occupational History  . Occupation: Counsellor: [employer]    Comment: [employer]  Tobacco Use  . Smoking status: Former Smoker    Packs/day: 1.00    Years: 20.00    Pack years: 20.00    Types: Cigarettes    Last attempt to quit: 06/04/2012    Years since quitting: 4.7  . Smokeless tobacco: Never Used  . Tobacco comment: e cigs periodically  Substance and Sexual Activity  . Alcohol use: Yes    Alcohol/week: 1.2 oz    Types: 2 Shots of liquor per week    Comment: socially  . Drug use: No  . Sexual activity: Yes    Birth control/protection: IUD  Other Topics Concern  . None  Social History Narrative  . None   Review of Systems - See HPI.  All other ROS are negative.  BP 120/80   Pulse 76   Temp 98 F (36.7 C) (Oral)   Resp 14   Ht 5\' 6"  (1.676 m)   Wt 181 lb (82.1 kg)   SpO2 99%   BMI  29.21 kg/m   Physical Exam  Constitutional: She is oriented to person, place, and time and well-developed, well-nourished, and in no distress.  HENT:  Head: Normocephalic and atraumatic.  Eyes: Conjunctivae are normal.  Neck: Neck supple.  Cardiovascular: Normal rate, regular rhythm, normal heart sounds and intact distal pulses.  Pulmonary/Chest: Effort normal and breath sounds normal. No respiratory distress. She has no wheezes. She has no rales. She exhibits no tenderness.  Neurological: She is alert and oriented to person, place, and time.  Skin: Skin is warm and dry. No rash noted.  Psychiatric: Affect normal.  Vitals reviewed.  Assessment/Plan: GAD (generalized anxiety disorder) Will cut back on Lamictal to 50 mg daily. Start low-dose Trintellix at 10 mg. Follow-up in 2 weeks.    ADHD (attention deficit hyperactivity disorder), inattentive type Doing very  well. Will continue current regimen. CSC updated. UDS today.    Leeanne Rio, PA-C

## 2017-02-28 LAB — PAIN MGMT, PROFILE 8 W/CONF, U
6 Acetylmorphine: NEGATIVE ng/mL (ref <10)
AMPHETAMINES: NEGATIVE ng/mL (ref <500)
Alcohol Metabolites: NEGATIVE ng/mL (ref <500)
BENZODIAZEPINES: NEGATIVE ng/mL (ref <100)
Buprenorphine, Urine: NEGATIVE ng/mL (ref <5)
Cocaine Metabolite: NEGATIVE ng/mL (ref <150)
Creatinine: 47 mg/dL
MDMA: NEGATIVE ng/mL (ref <500)
Marijuana Metabolite: 177 ng/mL — ABNORMAL HIGH (ref <5)
Marijuana Metabolite: POSITIVE ng/mL — AB (ref <20)
OXIDANT: NEGATIVE ug/mL (ref <200)
Opiates: NEGATIVE ng/mL (ref <100)
Oxycodone: NEGATIVE ng/mL (ref <100)
pH: 7.18 (ref 4.5–9.0)

## 2017-02-28 LAB — LAMOTRIGINE LEVEL: Lamotrigine Lvl: 1 ug/mL — ABNORMAL LOW (ref 4.0–18.0)

## 2017-03-01 ENCOUNTER — Other Ambulatory Visit: Payer: Self-pay | Admitting: Physician Assistant

## 2017-03-01 ENCOUNTER — Telehealth: Payer: Self-pay | Admitting: Emergency Medicine

## 2017-03-01 NOTE — Telephone Encounter (Signed)
Prior authorization for Trintellix has been approved until 02/26/2018 with limited supply of medication. [id number]  Prior authorization for Lamictal ODT has been approved until 02/28/2018 [id number]  Patient pharmacy notified of approvals.

## 2017-03-25 ENCOUNTER — Other Ambulatory Visit: Payer: Self-pay | Admitting: Physician Assistant

## 2017-03-25 ENCOUNTER — Telehealth: Payer: Self-pay | Admitting: Physician Assistant

## 2017-03-25 NOTE — Telephone Encounter (Signed)
Copied from [place] [id number]. Topic: Quick Communication - Rx Refill/Question >> Mar 25, 2017  2:17 PM Bea Graff, NT wrote: Medication: LamoTRIgine pt requesting 100mg  tablets   Has the patient contacted their pharmacy? Yes.     (Agent: If no, request that the patient contact the pharmacy for the refill.)   Preferred Pharmacy (with phone number or street name): [facility] [place]: Please be advised that RX refills may take up to 3 business days. We ask that you follow-up with your pharmacy.

## 2017-03-26 ENCOUNTER — Other Ambulatory Visit: Payer: Self-pay | Admitting: Family Medicine

## 2017-03-26 NOTE — Telephone Encounter (Signed)
Pt responded and advised she is requesting a 90 day supply.

## 2017-03-26 NOTE — Telephone Encounter (Signed)
Spoke with patient about refill of the Lamictal. Patient states she was not able to get the 50 mg due to dissovable tablet and did not start the Trintellix due to cost, even with discount card is $150. Patient wanted to continue the Lamictal 100 mg and will schedule an follow up appointment to discuss medication.

## 2017-04-23 ENCOUNTER — Ambulatory Visit (INDEPENDENT_AMBULATORY_CARE_PROVIDER_SITE_OTHER): Payer: 59 | Admitting: Physician Assistant

## 2017-04-23 ENCOUNTER — Encounter: Payer: Self-pay | Admitting: Physician Assistant

## 2017-04-23 ENCOUNTER — Other Ambulatory Visit: Payer: Self-pay | Admitting: Physician Assistant

## 2017-04-23 ENCOUNTER — Other Ambulatory Visit: Payer: Self-pay

## 2017-04-23 VITALS — BP 114/78 | HR 85 | Temp 98.4°F | Resp 16 | Ht 64.0 in | Wt 175.2 lb

## 2017-04-23 DIAGNOSIS — F319 Bipolar disorder, unspecified: Secondary | ICD-10-CM | POA: Diagnosis not present

## 2017-04-23 DIAGNOSIS — F900 Attention-deficit hyperactivity disorder, predominantly inattentive type: Secondary | ICD-10-CM

## 2017-04-23 DIAGNOSIS — Z0000 Encounter for general adult medical examination without abnormal findings: Secondary | ICD-10-CM | POA: Diagnosis not present

## 2017-04-23 DIAGNOSIS — F4310 Post-traumatic stress disorder, unspecified: Secondary | ICD-10-CM | POA: Diagnosis not present

## 2017-04-23 DIAGNOSIS — E559 Vitamin D deficiency, unspecified: Secondary | ICD-10-CM | POA: Diagnosis not present

## 2017-04-23 LAB — LIPID PANEL
CHOLESTEROL: 180 mg/dL (ref 0–200)
HDL: 59.3 mg/dL (ref >39.00)
LDL CALC: 109 mg/dL — AB (ref 0–99)
NonHDL: 120.84
TRIGLYCERIDES: 60 mg/dL (ref 0.0–149.0)
Total CHOL/HDL Ratio: 3
VLDL: 12 mg/dL (ref 0.0–40.0)

## 2017-04-23 LAB — COMPREHENSIVE METABOLIC PANEL
ALBUMIN: 4.7 g/dL (ref 3.5–5.2)
ALT: 15 U/L (ref 0–35)
AST: 17 U/L (ref 0–37)
Alkaline Phosphatase: 62 U/L (ref 39–117)
BUN: 11 mg/dL (ref 6–23)
CALCIUM: 9.5 mg/dL (ref 8.4–10.5)
CHLORIDE: 101 meq/L (ref 96–112)
CO2: 27 mEq/L (ref 19–32)
Creatinine, Ser: 0.71 mg/dL (ref 0.40–1.20)
GFR: 96.62 mL/min (ref >60.00)
Glucose, Bld: 101 mg/dL — ABNORMAL HIGH (ref 70–99)
POTASSIUM: 4.5 meq/L (ref 3.5–5.1)
Sodium: 136 mEq/L (ref 135–145)
TOTAL PROTEIN: 7.5 g/dL (ref 6.0–8.3)
Total Bilirubin: 1.9 mg/dL — ABNORMAL HIGH (ref 0.2–1.2)

## 2017-04-23 LAB — VITAMIN D 25 HYDROXY (VIT D DEFICIENCY, FRACTURES): VITD: 17.32 ng/mL — AB (ref 30.00–100.00)

## 2017-04-23 MED ORDER — AMPHETAMINE-DEXTROAMPHETAMINE 20 MG PO TABS: 20.0000 mg | ORAL_TABLET | Freq: Two times a day (BID) | ORAL | 0 refills | Status: DC

## 2017-04-23 MED ORDER — ARIPIPRAZOLE 15 MG PO TABS: 15.0000 mg | ORAL_TABLET | Freq: Every day | ORAL | 1 refills | Status: DC

## 2017-04-23 NOTE — Telephone Encounter (Signed)
Please see note from pharmacy. Thank you!

## 2017-04-23 NOTE — Assessment & Plan Note (Signed)
Repeat Vitamin D level today.

## 2017-04-23 NOTE — Assessment & Plan Note (Signed)
Doing well with current regimen. Repeat UDS today. Refill given.

## 2017-04-23 NOTE — Progress Notes (Signed)
Patient presents to clinic today for annual exam.  Patient is fasting for labs.  Acute Concerns: Denies acute concerns.   Chronic Issues: PTSD/GAD/Bipolar I Disorder -- At last visit, we attempted to wean down on the Lamictal due to weight gain noted. Trintellix was added to Lamictal but patient was unable to afford. States she was doing poorly on just the 50 mg Lamictal daily so she went back to 100 mg daily. Has noted losing weight while on lower dose but has since gained it back. Alprazolam maybe 1-2 x week for severe acute anxiety. Watching diet and portion sizes. Is staying well hydrated. Exercise -- Is trying to to stay active now that the weather is warming up.    ADHD -- Currently on Adderall 20 mg BID. Has been taking this regimen for quite some time. Notes works very well without side effect.  Due for repeat UDS.   Vitamin D Deficiency -- Prior history. No current supplementation.   Health Maintenance: Immunizations -- Declines flu shot. Tetanus up-to-date. Mammogram -- up-to-date. 01/2017. Normal. PAP -- up-to-date. 01/2017. Normal  Past Medical History:  Diagnosis Date  . Anxiety   . Cancer ([place])    Melanoma skin cancer, ankle, calf, and back of leg  . Depression    Dr. Rachel Moulds  . GERD (gastroesophageal reflux disease)     Past Surgical History:  Procedure Laterality Date  . TONSILLECTOMY AND ADENOIDECTOMY    . WISDOM TOOTH EXTRACTION      Current Outpatient Medications on File Prior to Visit  Medication Sig Dispense Refill  . ALPRAZolam (XANAX) 0.5 MG tablet Take 1 tablet (0.5 mg total) by mouth at bedtime. 30 tablet 0  . levonorgestrel (MIRENA) 20 MCG/24HR IUD 1 Intra Uterine Device (1 each total) by Intrauterine route once. 1 each 0   No current facility-administered medications on file prior to visit.     Allergies  Allergen Reactions  . Codeine Nausea And Vomiting  . Depo-Medrol [Methylprednisolone Acetate] Hives    Family History  Problem  Relation Age of Onset  . Lung cancer Father   . Hypertension Father   . Heart disease Father   . Alcohol abuse Neg Hx   . Diabetes Neg Hx   . Drug abuse Neg Hx   . Early death Neg Hx   . Hyperlipidemia Neg Hx   . Kidney disease Neg Hx   . Stroke Neg Hx     Social History   Socioeconomic History  . Marital status: Married    Spouse name: Not on file  . Number of children: 3  . Years of education: Not on file  . Highest education level: Not on file  Occupational History  . Occupation: Counsellor: [employer]    Comment: [employer]  . Financial resource strain: Not on file  . Food insecurity:    Worry: Not on file    Inability: Not on file  . Transportation needs:    Medical: Not on file    Non-medical: Not on file  Tobacco Use  . Smoking status: Former Smoker    Packs/day: 1.00    Years: 20.00    Pack years: 20.00    Types: Cigarettes    Last attempt to quit: 06/04/2012    Years since quitting: 4.8  . Smokeless tobacco: Never Used  . Tobacco comment: e cigs periodically  Substance and Sexual Activity  . Alcohol use: Yes    Alcohol/week:  1.2 oz    Types: 2 Shots of liquor per week    Comment: socially  . Drug use: No  . Sexual activity: Yes    Birth control/protection: IUD  Lifestyle  . Physical activity:    Days per week: Not on file    Minutes per session: Not on file  . Stress: Not on file  Relationships  . Social connections:    Talks on phone: Not on file    Gets together: Not on file    Attends religious service: Not on file    Active member of club or organization: Not on file    Attends meetings of clubs or organizations: Not on file    Relationship status: Not on file  . Intimate partner violence:    Fear of current or ex partner: Not on file    Emotionally abused: Not on file    Physically abused: Not on file    Forced sexual activity: Not on file  Other Topics Concern  . Not on file  Social History  Narrative  . Not on file   Review of Systems  Constitutional: Negative for fever and weight loss.  HENT: Negative for ear discharge, ear pain, hearing loss and tinnitus.   Eyes: Negative for blurred vision, double vision, photophobia and pain.  Respiratory: Negative for cough and shortness of breath.   Cardiovascular: Negative for chest pain and palpitations.  Gastrointestinal: Negative for abdominal pain, blood in stool, constipation, diarrhea, heartburn, melena, nausea and vomiting.  Genitourinary: Negative for dysuria, flank pain, frequency, hematuria and urgency.  Musculoskeletal: Negative for falls.  Neurological: Negative for dizziness, loss of consciousness and headaches.  Endo/Heme/Allergies: Negative for environmental allergies.  Psychiatric/Behavioral: Negative for depression, hallucinations, substance abuse and suicidal ideas. The patient is nervous/anxious. The patient does not have insomnia.     BP 114/78   Pulse 85   Temp 98.4 F (36.9 C) (Oral)   Resp 16   Ht 5\' 4"  (1.626 m)   Wt 175 lb 3.2 oz (79.5 kg)   SpO2 99%   BMI 30.07 kg/m   Physical Exam  Constitutional: She is oriented to person, place, and time and well-developed, well-nourished, and in no distress.  HENT:  Head: Normocephalic and atraumatic.  Right Ear: Tympanic membrane, external ear and ear canal normal.  Left Ear: Tympanic membrane, external ear and ear canal normal.  Nose: Nose normal. No mucosal edema.  Mouth/Throat: Uvula is midline, oropharynx is clear and moist and mucous membranes are normal. No oropharyngeal exudate or posterior oropharyngeal erythema.  Eyes: Pupils are equal, round, and reactive to light. Conjunctivae are normal.  Neck: Neck supple. No thyromegaly present.  Cardiovascular: Normal rate, regular rhythm, normal heart sounds and intact distal pulses.  Pulmonary/Chest: Effort normal and breath sounds normal. No respiratory distress. She has no wheezes. She has no rales.    Abdominal: Soft. Bowel sounds are normal. She exhibits no distension and no mass. There is no tenderness. There is no rebound and no guarding.  Lymphadenopathy:    She has no cervical adenopathy.  Neurological: She is alert and oriented to person, place, and time. No cranial nerve deficit.  Skin: Skin is warm and dry. No rash noted.  Psychiatric: Affect normal.  Vitals reviewed.   Recent Results (from the past 2160 hour(s))  Pain Mgmt, Profile 8 w/Conf, U     Status: Abnormal   Collection Time: 02/25/17  1:44 PM  Result Value Ref Range   Creatinine 47.0 >  or = 20. mg/dL   pH 7.18 4.5 - 9.0   Oxidant NEGATIVE <200 mcg/mL   Amphetamines NEGATIVE <500 ng/mL   medMATCH Amphetamines CONSISTENT    Benzodiazepines NEGATIVE <100 ng/mL   medMATCH Benzodiazepines CONSISTENT    Marijuana Metabolite POSITIVE (A) <20 ng/mL   Marijuana Metabolite 177 (H) <5 ng/mL    Comment: See Note 1   medMATCH Marijuana Metab INCONSISTENT    Cocaine Metabolite NEGATIVE <150 ng/mL   medMATCH Cocaine Metab CONSISTENT    Opiates NEGATIVE <100 ng/mL   medMATCH Opiates CONSISTENT    Oxycodone NEGATIVE <100 ng/mL   medMATCH Oxycodone CONSISTENT     Comment: Note 1 . This test was developed and its analytical performance  characteristics have been determined by [employer]. It has not been cleared or approved by the FDA. This assay has been validated pursuant to the CLIA  regulations and is used for clinical purposes.    Buprenorphine, Urine NEGATIVE <5 ng/mL   medMATCH Buprenorphine CONSISTENT    MDMA NEGATIVE <500 ng/mL   [facility] MDMA CONSISTENT    Alcohol Metabolites NEGATIVE <500 ng/mL   medMATCH Alcohol Metab CONSISTENT    6 Acetylmorphine NEGATIVE <10 ng/mL   medMATCH 6 Acetylmorphine CONSISTENT     Comment: This drug testing is for medical treatment only.   Analysis was performed as non-forensic testing and  these results should be used only by healthcare  providers to render diagnosis  or treatment, or to  monitor progress of medical conditions. Hazel Sams comments are:  - present when drug test results may be the result of     metabolism of one or more drugs or when results are     inconsistent with prescribed medication(s) listed.  - may be blank when drug results are consistent with     prescribed medication(s) listed. . For assistance with interpreting these drug results,  please contact a [employer] Toxicology  Specialist: [phone] [place]  [phone]), M-F,  8am-6pm EST. This drug testing is for medical treatment only.   Analysis was performed as non-forensic testing and  these results should be used only by healthcare  providers to render diagnosis or treatment, or to  monitor progress of medical conditions. Hazel Sams comments are:  - present when  drug test results may be the result of     metabolism of one or more drugs or when results are     inconsistent with prescribed medication(s) listed.  - may be blank when drug results are consistent with     prescribed medication(s) listed. . For assistance with interpreting these drug results,  please contact a [employer] Toxicology  Specialist: [phone] [place] [phone]), M-F,  8am-6pm EST. This drug testing is for medical treatment only.   Analysis was performed as non-forensic testing and  these results should be used only by healthcare  providers to render diagnosis or treatment, or to  monitor progress of medical conditions. Hazel Sams comments are:  - present when drug test results may be the result of     metabolism of one or more drugs or when results are     inconsistent with prescribed medication(s) listed.  - may be blank when drug results are consistent with     prescribed medication(s) listed. . For assistance with interpreting these d rug results,  please contact a [employer] Toxicology  Specialist: [phone] [place] [phone]), M-F,  8am-6pm  EST. This drug testing is for medical treatment only.   Analysis was  performed as non-forensic testing and  these results should be used only by healthcare  providers to render diagnosis or treatment, or to  monitor progress of medical conditions. Hazel Sams comments are:  - present when drug test results may be the result of     metabolism of one or more drugs or when results are     inconsistent with prescribed medication(s) listed.  - may be blank when drug results are consistent with     prescribed medication(s) listed. . For assistance with interpreting these drug results,  please contact a [employer] Toxicology  Specialist: [phone] [place] [phone]), M-F,  8am-6pm EST. This drug testing is for medical treatment only.   Analysis was performed as non-forensic testing and  these results should be used only by healthcare  provi ders to render diagnosis or treatment, or to  monitor progress of medical conditions. Hazel Sams comments are:  - present when drug test results may be the result of     metabolism of one or more drugs or when results are     inconsistent with prescribed medication(s) listed.  - may be blank when drug results are consistent with     prescribed medication(s) listed. . For assistance with interpreting these drug results,  please contact a [employer] Toxicology  Specialist: [phone] [place] [phone]), M-F,  8am-6pm EST.   CBC w/Diff     Status: None   Collection Time: 02/25/17  1:44 PM  Result Value Ref Range   WBC 7.8 4.0 - 10.5 K/uL   RBC 4.45 3.87 - 5.11 Mil/uL   Hemoglobin 13.7 12.0 - 15.0 g/dL   HCT 40.7 36.0 - 46.0 %   MCV 91.5 78.0 - 100.0 fl   MCHC 33.7 30.0 - 36.0 g/dL   RDW 14.0 11.5 - 15.5 %   Platelets 229.0 150.0 - 400.0 K/uL   Neutrophils Relative % 52.5 43.0 - 77.0 %   Lymphocytes Relative 40.7 12.0 - 46.0 %   Monocytes Relative 4.3 3.0 - 12.0 %   Eosinophils Relative 1.9 0.0 - 5.0 %   Basophils  Relative 0.6 0.0 - 3.0 %   Neutro Abs 4.1 1.4 - 7.7 K/uL   Lymphs Abs 3.2 0.7 - 4.0 K/uL   Monocytes Absolute 0.3 0.1 - 1.0 K/uL   Eosinophils Absolute 0.1 0.0 - 0.7 K/uL   Basophils Absolute 0.0 0.0 - 0.1 K/uL  TSH     Status: None   Collection Time: 02/25/17  1:44 PM  Result Value Ref Range   TSH 3.15 0.35 - 4.50 uIU/mL  Lamotrigine level     Status: Abnormal   Collection Time: 02/25/17  1:44 PM  Result Value Ref Range   Lamotrigine Lvl 1.0 (L) 4.0 - 18.0 mcg/mL    Comment:  This test was developed and its analytical performance  characteristics have been determined by [facility]. It has not been cleared or approved by the [place] Food and Drug Administration. This assay has been validated  pursuant to the CLIA regulations and is used for clinical  purposes.     Assessment/Plan: ADHD (attention deficit hyperactivity disorder), inattentive type Doing well with current regimen. Repeat UDS today. Refill given.   Bipolar I disorder ([place]) Will switch Lamictal for Abilify 15 mg daily. Continue Alprazolam as directed. Close follow-up scheduled.   Visit for preventive health examination Depression screen negative. Health Maintenance reviewed. Preventive schedule discussed and handout given in AVS. Will obtain fasting labs today.  Vitamin D deficiency Repeat Vitamin D level today.    Leeanne Rio, PA-C

## 2017-04-23 NOTE — Assessment & Plan Note (Signed)
Depression screen negative. Health Maintenance reviewed. Preventive schedule discussed and handout given in AVS. Will obtain fasting labs today.  

## 2017-04-23 NOTE — Patient Instructions (Signed)
Please go to the lab for blood work.   Our office will call you with your results unless you have chosen to receive results via MyChart.  If your blood work is normal we will follow-up each year for physicals and as scheduled for chronic medical problems.  If anything is abnormal we will treat accordingly and get you in for a follow-up.  Please stop the Lamictal and start the Abilify. Take daily as directed. Continue Adderall as directed. Follow-up with me in 3-4 weeks.  Return immediately for any worsening of mood.   Please check with insurance about the [place] testing.    Preventive Care 40-64 Years, Female Preventive care refers to lifestyle choices and visits with your health care provider that can promote health and wellness. What does preventive care include?  A yearly physical exam. This is also called an annual well check.  Dental exams once or twice a year.  Routine eye exams. Ask your health care provider how often you should have your eyes checked.  Personal lifestyle choices, including: ? Daily care of your teeth and gums. ? Regular physical activity. ? Eating a healthy diet. ? Avoiding tobacco and drug use. ? Limiting alcohol use. ? Practicing safe sex. ? Taking low-dose aspirin daily starting at age 27. ? Taking vitamin and mineral supplements as recommended by your health care provider. What happens during an annual well check? The services and screenings done by your health care provider during your annual well check will depend on your age, overall health, lifestyle risk factors, and family history of disease. Counseling Your health care provider may ask you questions about your:  Alcohol use.  Tobacco use.  Drug use.  Emotional well-being.  Home and relationship well-being.  Sexual activity.  Eating habits.  Work and work Statistician.  Method of birth control.  Menstrual cycle.  Pregnancy history.  Screening You may have the  following tests or measurements:  Height, weight, and BMI.  Blood pressure.  Lipid and cholesterol levels. These may be checked every 5 years, or more frequently if you are over 42 years old.  Skin check.  Lung cancer screening. You may have this screening every year starting at age 107 if you have a 30-pack-year history of smoking and currently smoke or have quit within the past 15 years.  Fecal occult blood test (FOBT) of the stool. You may have this test every year starting at age 63.  Flexible sigmoidoscopy or colonoscopy. You may have a sigmoidoscopy every 5 years or a colonoscopy every 10 years starting at age 21.  Hepatitis C blood test.  Hepatitis B blood test.  Sexually transmitted disease (STD) testing.  Diabetes screening. This is done by checking your blood sugar (glucose) after you have not eaten for a while (fasting). You may have this done every 1-3 years.  Mammogram. This may be done every 1-2 years. Talk to your health care provider about when you should start having regular mammograms. This may depend on whether you have a family history of breast cancer.  BRCA-related cancer screening. This may be done if you have a family history of breast, ovarian, tubal, or peritoneal cancers.  Pelvic exam and Pap test. This may be done every 3 years starting at age 26. Starting at age 51, this may be done every 5 years if you have a Pap test in combination with an HPV test.  Bone density scan. This is done to screen for osteoporosis. You may have this scan if  you are at high risk for osteoporosis.  Discuss your test results, treatment options, and if necessary, the need for more tests with your health care provider. Vaccines Your health care provider may recommend certain vaccines, such as:  Influenza vaccine. This is recommended every year.  Tetanus, diphtheria, and acellular pertussis (Tdap, Td) vaccine. You may need a Td booster every 10 years.  Varicella vaccine. You  may need this if you have not been vaccinated.  Zoster vaccine. You may need this after age 87.  Measles, mumps, and rubella (MMR) vaccine. You may need at least one dose of MMR if you were born in 1957 or later. You may also need a second dose.  Pneumococcal 13-valent conjugate (PCV13) vaccine. You may need this if you have certain conditions and were not previously vaccinated.  Pneumococcal polysaccharide (PPSV23) vaccine. You may need one or two doses if you smoke cigarettes or if you have certain conditions.  Meningococcal vaccine. You may need this if you have certain conditions.  Hepatitis A vaccine. You may need this if you have certain conditions or if you travel or work in places where you may be exposed to hepatitis A.  Hepatitis B vaccine. You may need this if you have certain conditions or if you travel or work in places where you may be exposed to hepatitis B.  Haemophilus influenzae type b (Hib) vaccine. You may need this if you have certain conditions.  Talk to your health care provider about which screenings and vaccines you need and how often you need them. This information is not intended to replace advice given to you by your health care provider. Make sure you discuss any questions you have with your health care provider. Document Released: 02/11/2015 Document Revised: 10/05/2015 Document Reviewed: 11/16/2014 Elsevier Interactive Patient Education  [employer].

## 2017-04-23 NOTE — Assessment & Plan Note (Signed)
Will switch Lamictal for Abilify 15 mg daily. Continue Alprazolam as directed. Close follow-up scheduled.

## 2017-04-24 ENCOUNTER — Other Ambulatory Visit: Payer: Self-pay

## 2017-04-24 ENCOUNTER — Other Ambulatory Visit (INDEPENDENT_AMBULATORY_CARE_PROVIDER_SITE_OTHER): Payer: 59

## 2017-04-24 DIAGNOSIS — R7309 Other abnormal glucose: Secondary | ICD-10-CM

## 2017-04-24 LAB — HEMOGLOBIN A1C: HEMOGLOBIN A1C: 5.3 % (ref 4.6–6.5)

## 2017-04-24 MED ORDER — VITAMIN D (ERGOCALCIFEROL) 1.25 MG (50000 UNIT) PO CAPS: 50000.0000 [IU] | ORAL_CAPSULE | ORAL | 0 refills | Status: DC

## 2017-04-25 LAB — PAIN MGMT, PROFILE 8 W/CONF, U
6 Acetylmorphine: NEGATIVE ng/mL (ref <10)
AMPHETAMINE: 11972 ng/mL — AB (ref <250)
AMPHETAMINES: POSITIVE ng/mL — AB (ref <500)
Alcohol Metabolites: NEGATIVE ng/mL (ref <500)
BENZODIAZEPINES: NEGATIVE ng/mL (ref <100)
BUPRENORPHINE, URINE: NEGATIVE ng/mL (ref <5)
Cocaine Metabolite: NEGATIVE ng/mL (ref <150)
Creatinine: 197 mg/dL
MARIJUANA METABOLITE: 9 ng/mL — AB (ref <5)
MARIJUANA METABOLITE: POSITIVE ng/mL — AB (ref <20)
MDMA: NEGATIVE ng/mL (ref <500)
METHAMPHETAMINE: NEGATIVE ng/mL (ref <250)
OPIATES: NEGATIVE ng/mL (ref <100)
Oxidant: NEGATIVE ug/mL (ref <200)
Oxycodone: NEGATIVE ng/mL (ref <100)
pH: 5.98 (ref 4.5–9.0)

## 2017-04-26 ENCOUNTER — Other Ambulatory Visit: Payer: Self-pay | Admitting: Physician Assistant

## 2017-04-26 ENCOUNTER — Other Ambulatory Visit: Payer: Self-pay | Admitting: Family Medicine

## 2017-04-26 ENCOUNTER — Telehealth: Payer: Self-pay | Admitting: Physician Assistant

## 2017-04-26 MED ORDER — LAMOTRIGINE 50 MG PO TBDP: ORAL_TABLET | ORAL | 0 refills | Status: DC

## 2017-04-26 NOTE — Addendum Note (Signed)
Addended by: Katina Dung on: 04/26/2017 11:43 AM   Modules accepted: Orders

## 2017-04-26 NOTE — Telephone Encounter (Signed)
Pt. Called concerned about medication changes. States she thought she needed to be weaned of the Lamictal. She reports the last time she tried to "stop the Lamictal, she had a flip out moment." She did not pick up the Abilify yet - wants clarification first. Is completley out of the Lamictal. Please advise pt.

## 2017-04-26 NOTE — Telephone Encounter (Signed)
Send her in 14 tablets of Lamictal 50 mg -- Have her take once daily for 1 week, then every other day for 1 week before quitting.   At the same time, have her take 1/2 tablet of the Abilify daily x 1 week, then increase to 1 tablet daily.

## 2017-04-26 NOTE — Telephone Encounter (Signed)
Called in medication and discussed directions with how to take everything. Patient repeated back to me and stated verbal understanding.

## 2017-04-26 NOTE — Telephone Encounter (Signed)
Last OV 04/23/2017, no future appointment scheduled  Last filled today, 04/26/2017, 14 tablets with 0 refills.  Patient is requesting a 90 day supply

## 2017-04-29 ENCOUNTER — Telehealth: Payer: Self-pay | Admitting: *Deleted

## 2017-04-29 MED ORDER — LAMOTRIGINE 100 MG PO TABS: ORAL_TABLET | ORAL | 0 refills | Status: DC

## 2017-04-29 NOTE — Telephone Encounter (Signed)
Ok to switch to lamictal 100 mg -- take 1/2 tablet once daily for 1 week. Then 1/2 tablet every other day for 1 week before stopping. Quantity 7

## 2017-04-29 NOTE — Telephone Encounter (Signed)
Patient states that the 50 mg is too expensive for just the 14 pills.  She is asking if we can call in the ones she was on before (100mg  regular tab) and let her cut them in half.  She is aware that she would take the 1/2 pill every day for one week, then 1/2 pill every other day for one week.   Along with the new Abilify 1 tablet daily.  Routing to PCP to advise if it is okay to be called in that way.  Copied from [phone]. Topic: General - Other >> Apr 29, 2017  1:15 PM Yvette Rack wrote: Reason for CRM: pt calling wanting a new RX the lamoTRIgine 50 MG TBDP cost $90 for #14 pills   >> Apr 29, 2017  1:17 PM Yvette Rack wrote: Pt states that she has to have this medicine today she cant miss a dose of this medicine

## 2017-04-29 NOTE — Telephone Encounter (Signed)
Medication has been called in.  Patient aware and stated verbal understanding on directions.

## 2017-05-20 ENCOUNTER — Other Ambulatory Visit: Payer: Self-pay

## 2017-05-20 ENCOUNTER — Encounter: Payer: Self-pay | Admitting: Physician Assistant

## 2017-05-20 ENCOUNTER — Ambulatory Visit (INDEPENDENT_AMBULATORY_CARE_PROVIDER_SITE_OTHER): Payer: 59 | Admitting: Physician Assistant

## 2017-05-20 VITALS — BP 104/80 | HR 82 | Temp 98.5°F | Resp 14 | Ht 64.0 in | Wt 180.0 lb

## 2017-05-20 DIAGNOSIS — F900 Attention-deficit hyperactivity disorder, predominantly inattentive type: Secondary | ICD-10-CM | POA: Diagnosis not present

## 2017-05-20 DIAGNOSIS — F319 Bipolar disorder, unspecified: Secondary | ICD-10-CM | POA: Diagnosis not present

## 2017-05-20 MED ORDER — LAMOTRIGINE 100 MG PO TABS: 100.0000 mg | ORAL_TABLET | Freq: Every day | ORAL | 0 refills | Status: DC

## 2017-05-20 NOTE — Patient Instructions (Addendum)
Please decrease the Abilify to 1/2 tablet daily x 1 week, then 1/2 tablet every other day for another week before completely stopping.  Start the Lamictal taking 1/4 tablet (25 mg) daily for 1 week, then increasing to 1/2 tablet daily for 2 weeks. After that, you can increase to 1 tablet (100 mg) daily. Follow-up with me in 3-4 weeks.   Please continue other medications as directed. If you note any worsening mood, please call me or go to the ER for assessment.Marland Kitchen

## 2017-05-20 NOTE — Progress Notes (Signed)
Patient presents to clinic today to discuss medication management for her depression. At last visit we cross-tapered her from Lamictal (due to significant weight gain), to a trial of Abilify 15 mg daily. States she made the transition well but is having a hard time tolerating the Abilify now. Feels that her mood is fluctuating and she feels jittery all of the time. Is unable to focus despite chronic use of Adderall. The medication is affecting her sleep. She is wanting to go back to her Lamictal and work harder on diet and exercise. Denies any SI/HI.   Past Medical History:  Diagnosis Date  . Anxiety   . Cancer ([place])    Melanoma skin cancer, ankle, calf, and back of leg  . Depression    Dr. Rachel Moulds  . GERD (gastroesophageal reflux disease)     Current Outpatient Medications on File Prior to Visit  Medication Sig Dispense Refill  . ALPRAZolam (XANAX) 0.5 MG tablet Take 1 tablet (0.5 mg total) by mouth at bedtime. 30 tablet 0  . amphetamine-dextroamphetamine (ADDERALL) 20 MG tablet Take 1 tablet (20 mg total) by mouth 2 (two) times daily. 60 tablet 0  . levonorgestrel (MIRENA) 20 MCG/24HR IUD 1 Intra Uterine Device (1 each total) by Intrauterine route once. 1 each 0  . Vitamin D, Ergocalciferol, (DRISDOL) 50000 units CAPS capsule Take 1 capsule (50,000 Units total) by mouth every 7 (seven) days. 12 capsule 0   No current facility-administered medications on file prior to visit.     Allergies  Allergen Reactions  . Codeine Nausea And Vomiting  . Depo-Medrol [Methylprednisolone Acetate] Hives    Family History  Problem Relation Age of Onset  . Lung cancer Father   . Hypertension Father   . Heart disease Father   . Alcohol abuse Neg Hx   . Diabetes Neg Hx   . Drug abuse Neg Hx   . Early death Neg Hx   . Hyperlipidemia Neg Hx   . Kidney disease Neg Hx   . Stroke Neg Hx     Social History   Socioeconomic History  . Marital status: Married    Spouse name: Not on file    . Number of children: 3  . Years of education: Not on file  . Highest education level: Not on file  Occupational History  . Occupation: Counsellor: [employer]    Comment: [place]  . Financial resource strain: Not on file  . Food insecurity:    Worry: Not on file    Inability: Not on file  . Transportation needs:    Medical: Not on file    Non-medical: Not on file  Tobacco Use  . Smoking status: Former Smoker    Packs/day: 1.00    Years: 20.00    Pack years: 20.00    Types: Cigarettes    Last attempt to quit: 06/04/2012    Years since quitting: 4.9  . Smokeless tobacco: Never Used  . Tobacco comment: e cigs periodically  Substance and Sexual Activity  . Alcohol use: Yes    Alcohol/week: 1.2 oz    Types: 2 Shots of liquor per week    Comment: socially  . Drug use: No  . Sexual activity: Yes    Birth control/protection: IUD  Lifestyle  . Physical activity:    Days per week: Not on file    Minutes per session: Not on file  . Stress: Not on file  Relationships  .  Social connections:    Talks on phone: Not on file    Gets together: Not on file    Attends religious service: Not on file    Active member of club or organization: Not on file    Attends meetings of clubs or organizations: Not on file    Relationship status: Not on file  Other Topics Concern  . Not on file  Social History Narrative  . Not on file   Review of Systems - See HPI.  All other ROS are negative.  BP 104/80   Pulse 82   Temp 98.5 F (36.9 C) (Oral)   Resp 14   Ht 5' 4"  (1.626 m)   Wt 180 lb (81.6 kg)   SpO2 98%   BMI 30.90 kg/m   Physical Exam  Constitutional: She is oriented to person, place, and time. She appears well-developed and well-nourished.  HENT:  Head: Normocephalic and atraumatic.  Cardiovascular: Normal rate, regular rhythm and normal heart sounds.  Pulmonary/Chest: Effort normal and breath sounds normal.  Neurological: She is alert  and oriented to person, place, and time.  Skin: Skin is warm.  Psychiatric: She has a normal mood and affect.  Vitals reviewed.   Recent Results (from the past 2160 hour(s))  Pain Mgmt, Profile 8 w/Conf, U     Status: Abnormal   Collection Time: 02/25/17  1:44 PM  Result Value Ref Range   Creatinine 47.0 > or = 20. mg/dL   pH 7.18 4.5 - 9.0   Oxidant NEGATIVE <200 mcg/mL   Amphetamines NEGATIVE <500 ng/mL   medMATCH Amphetamines CONSISTENT    Benzodiazepines NEGATIVE <100 ng/mL   medMATCH Benzodiazepines CONSISTENT    Marijuana Metabolite POSITIVE (A) <20 ng/mL   Marijuana Metabolite 177 (H) <5 ng/mL    Comment: See Note 1   medMATCH Marijuana Metab INCONSISTENT    Cocaine Metabolite NEGATIVE <150 ng/mL   medMATCH Cocaine Metab CONSISTENT    Opiates NEGATIVE <100 ng/mL   medMATCH Opiates CONSISTENT    Oxycodone NEGATIVE <100 ng/mL   medMATCH Oxycodone CONSISTENT     Comment: Note 1 . This test was developed and its analytical performance  characteristics have been determined by [employer]. It has not been cleared or approved by the FDA. This assay has been validated pursuant to the CLIA  regulations and is used for clinical purposes.    Buprenorphine, Urine NEGATIVE <5 ng/mL   medMATCH Buprenorphine CONSISTENT    MDMA NEGATIVE <500 ng/mL   [facility] MDMA CONSISTENT    Alcohol Metabolites NEGATIVE <500 ng/mL   medMATCH Alcohol Metab CONSISTENT    6 Acetylmorphine NEGATIVE <10 ng/mL   medMATCH 6 Acetylmorphine CONSISTENT     Comment: This drug testing is for medical treatment only.   Analysis was performed as non-forensic testing and  these results should be used only by healthcare  providers to render diagnosis or treatment, or to  monitor progress of medical conditions. Hazel Sams comments are:  - present when drug test results may be the result of     metabolism of one or more drugs or when results are     inconsistent with prescribed medication(s)  listed.  - may be blank when drug results are consistent with     prescribed medication(s) listed. . For assistance with interpreting these drug results,  please contact a [employer] Toxicology  Specialist: [phone] [place] [phone]), M-F,  8am-6pm EST. This drug testing is for medical treatment only.   Analysis  was performed as non-forensic testing and  these results should be used only by healthcare  providers to render diagnosis or treatment, or to  monitor progress of medical conditions. Hazel Sams comments are:  - present when  drug test results may be the result of     metabolism of one or more drugs or when results are     inconsistent with prescribed medication(s) listed.  - may be blank when drug results are consistent with     prescribed medication(s) listed. . For assistance with interpreting these drug results,  please contact a [employer] Toxicology  Specialist: [phone] [place] [phone]), M-F,  8am-6pm EST. This drug testing is for medical treatment only.   Analysis was performed as non-forensic testing and  these results should be used only by healthcare  providers to render diagnosis or treatment, or to  monitor progress of medical conditions. Hazel Sams comments are:  - present when drug test results may be the result of     metabolism of one or more drugs or when results are     inconsistent with prescribed medication(s) listed.  - may be blank when drug results are consistent with     prescribed medication(s) listed. . For assistance with interpreting these d rug results,  please contact a [employer] Toxicology  Specialist: [phone] [place] [phone]), M-F,  8am-6pm EST. This drug testing is for medical treatment only.   Analysis was performed as non-forensic testing and  these results should be used only by healthcare  providers to render diagnosis or treatment, or to  monitor progress of medical  conditions. Hazel Sams comments are:  - present when drug test results may be the result of     metabolism of one or more drugs or when results are     inconsistent with prescribed medication(s) listed.  - may be blank when drug results are consistent with     prescribed medication(s) listed. . For assistance with interpreting these drug results,  please contact a [employer] Toxicology  Specialist: [phone] [place] [phone]), M-F,  8am-6pm EST. This drug testing is for medical treatment only.   Analysis was performed as non-forensic testing and  these results should be used only by healthcare  provi ders to render diagnosis or treatment, or to  monitor progress of medical conditions. Hazel Sams comments are:  - present when drug test results may be the result of     metabolism of one or more drugs or when results are     inconsistent with prescribed medication(s) listed.  - may be blank when drug results are consistent with     prescribed medication(s) listed. . For assistance with interpreting these drug results,  please contact a [employer] Toxicology  Specialist: [phone] [place] [phone]), M-F,  8am-6pm EST.   CBC w/Diff     Status: None   Collection Time: 02/25/17  1:44 PM  Result Value Ref Range   WBC 7.8 4.0 - 10.5 K/uL   RBC 4.45 3.87 - 5.11 Mil/uL   Hemoglobin 13.7 12.0 - 15.0 g/dL   HCT 40.7 36.0 - 46.0 %   MCV 91.5 78.0 - 100.0 fl   MCHC 33.7 30.0 - 36.0 g/dL   RDW 14.0 11.5 - 15.5 %   Platelets 229.0 150.0 - 400.0 K/uL   Neutrophils Relative % 52.5 43.0 - 77.0 %   Lymphocytes Relative 40.7 12.0 - 46.0 %   Monocytes Relative 4.3 3.0 - 12.0 %   Eosinophils Relative  1.9 0.0 - 5.0 %   Basophils Relative 0.6 0.0 - 3.0 %   Neutro Abs 4.1 1.4 - 7.7 K/uL   Lymphs Abs 3.2 0.7 - 4.0 K/uL   Monocytes Absolute 0.3 0.1 - 1.0 K/uL   Eosinophils Absolute 0.1 0.0 - 0.7 K/uL   Basophils Absolute 0.0 0.0 - 0.1 K/uL  TSH     Status: None    Collection Time: 02/25/17  1:44 PM  Result Value Ref Range   TSH 3.15 0.35 - 4.50 uIU/mL  Lamotrigine level     Status: Abnormal   Collection Time: 02/25/17  1:44 PM  Result Value Ref Range   Lamotrigine Lvl 1.0 (L) 4.0 - 18.0 mcg/mL    Comment:  This test was developed and its analytical performance  characteristics have been determined by [facility]. It has not been cleared or approved by the [place] Food and Drug Administration. This assay has been validated  pursuant to the CLIA regulations and is used for clinical  purposes.   Lipid Profile     Status: Abnormal   Collection Time: 04/23/17 10:56 AM  Result Value Ref Range   Cholesterol 180 0 - 200 mg/dL    Comment: ATP III Classification       Desirable:  < 200 mg/dL               Borderline High:  200 - 239 mg/dL          High:  > = 240 mg/dL   Triglycerides 60.0 0.0 - 149.0 mg/dL    Comment: Normal:  <150 mg/dLBorderline High:  150 - 199 mg/dL   HDL 59.30 >39.00 mg/dL   VLDL 12.0 0.0 - 40.0 mg/dL   LDL Cholesterol 109 (H) 0 - 99 mg/dL   Total CHOL/HDL Ratio 3     Comment:                Men          Women1/2 Average Risk     3.4          3.3Average Risk          5.0          4.42X Average Risk          9.6          7.13X Average Risk          15.0          11.0                       NonHDL 120.84     Comment: NOTE:  Non-HDL goal should be 30 mg/dL higher than patient's LDL goal (i.e. LDL goal of < 70 mg/dL, would have non-HDL goal of < 100 mg/dL)  Comp Met (CMET)     Status: Abnormal   Collection Time: 04/23/17 10:56 AM  Result Value Ref Range   Sodium 136 135 - 145 mEq/L   Potassium 4.5 3.5 - 5.1 mEq/L   Chloride 101 96 - 112 mEq/L   CO2 27 19 - 32 mEq/L   Glucose, Bld 101 (H) 70 - 99 mg/dL   BUN 11 6 - 23 mg/dL   Creatinine, Ser 0.71 0.40 - 1.20 mg/dL   Total Bilirubin 1.9 (H) 0.2 - 1.2 mg/dL   Alkaline Phosphatase 62 39 - 117 U/L   AST 17 0 - 37 U/L   ALT 15 0 - 35 U/L  Total Protein  7.5 6.0 - 8.3 g/dL   Albumin 4.7 3.5 - 5.2 g/dL   Calcium 9.5 8.4 - 10.5 mg/dL   GFR 96.62 >60.00 mL/min  Vitamin D (25 hydroxy)     Status: Abnormal   Collection Time: 04/23/17 10:56 AM  Result Value Ref Range   VITD 17.32 (L) 30.00 - 100.00 ng/mL  Pain Mgmt, Profile 8 w/Conf, U     Status: Abnormal   Collection Time: 04/23/17 11:00 AM  Result Value Ref Range   Creatinine 197.0 > or = 20. mg/dL   pH 5.98 4.5 - 9.0   Oxidant NEGATIVE <200 mcg/mL   Amphetamines POSITIVE (A) <500 ng/mL   Amphetamine 11,972 (H) <250 ng/mL    Comment: See Note 1   medMATCH Amphetamine INCONSISTENT     Comment: See Note 2   Methamphetamine NEGATIVE <250 ng/mL    Comment: See Note 1   medMATCH Methamphetamine CONSISTENT    Benzodiazepines NEGATIVE <100 ng/mL   medMATCH Benzodiazepines CONSISTENT    Marijuana Metabolite POSITIVE (A) <20 ng/mL   Marijuana Metabolite 9 (H) <5 ng/mL    Comment: See Note 1   medMATCH Marijuana Metab INCONSISTENT    Cocaine Metabolite NEGATIVE <150 ng/mL   medMATCH Cocaine Metab CONSISTENT    Opiates NEGATIVE <100 ng/mL   medMATCH Opiates CONSISTENT    Oxycodone NEGATIVE <100 ng/mL   medMATCH Oxycodone CONSISTENT     Comment: Note 1 . This test was developed and its analytical performance  characteristics have been determined by [employer]. It has not been cleared or approved by the FDA. This assay has been validated pursuant to the CLIA  regulations and is used for clinical purposes. . Note 2 Amphetamine is a metabolite of methamphetamine as well  as a prescribed drug.    Buprenorphine, Urine NEGATIVE <5 ng/mL   medMATCH Buprenorphine CONSISTENT    MDMA NEGATIVE <500 ng/mL   [facility] MDMA CONSISTENT    Alcohol Metabolites NEGATIVE <500 ng/mL   medMATCH Alcohol Metab CONSISTENT    6 Acetylmorphine NEGATIVE <10 ng/mL   medMATCH 6 Acetylmorphine CONSISTENT     Comment: This drug testing is for medical treatment only.   Analysis was performed as  non-forensic testing and  these results should be used only by healthcare  providers to render diagnosis or treatment, or to  monitor progress of medical conditions. Hazel Sams comments are:  - present when drug test results may be the result of     metabolism of one or more drugs or when results are     inconsistent with prescribed medication(s) listed.  - may be blank when drug results are consistent with     prescribed medication(s) listed. . For assistance with interpreting these drug results,  please contact a [employer] Toxicology  Specialist: [phone] [place] [phone]), M-F,  8am-6pm EST. This drug testing is for medical treatment only.   Analysis was performed as non-forensic testing and  these results should be used only by healthcare  providers to render diagnosis or treatment, or to  monitor progress of medical conditions. Hazel Sams comments are:  - present when  drug test results may be the result of     metabolism of one or more drugs or when results are     inconsistent with prescribed medication(s) listed.  - may be blank when drug results are consistent with     prescribed medication(s) listed. . For assistance with interpreting these drug results,  please contact  a Systems [employer]  Specialist: [phone] [place] [phone]), M-F,  8am-6pm EST. This drug testing is for medical treatment only.   Analysis was performed as non-forensic testing and  these results should be used only by healthcare  providers to render diagnosis or treatment, or to  monitor progress of medical conditions. Hazel Sams comments are:  - present when drug test results may be the result of     metabolism of one or more drugs or when results are     inconsistent with prescribed medication(s) listed.  - may be blank when drug results are consistent with     prescribed medication(s) listed. . For assistance with interpreting these d rug results,  please  contact a [employer] Toxicology  Specialist: [phone] [place] [phone]), M-F,  8am-6pm EST. This drug testing is for medical treatment only.   Analysis was performed as non-forensic testing and  these results should be used only by healthcare  providers to render diagnosis or treatment, or to  monitor progress of medical conditions. Hazel Sams comments are:  - present when drug test results may be the result of     metabolism of one or more drugs or when results are     inconsistent with prescribed medication(s) listed.  - may be blank when drug results are consistent with     prescribed medication(s) listed. . For assistance with interpreting these drug results,  please contact a [employer] Toxicology  Specialist: [phone] [place] [phone]), M-F,  8am-6pm EST. This drug testing is for medical treatment only.   Analysis was performed as non-forensic testing and  these results should be used only by healthcare  provi ders to render diagnosis or treatment, or to  monitor progress of medical conditions. Hazel Sams comments are:  - present when drug test results may be the result of     metabolism of one or more drugs or when results are     inconsistent with prescribed medication(s) listed.  - may be blank when drug results are consistent with     prescribed medication(s) listed. . For assistance with interpreting these drug results,  please contact a [employer] Toxicology  Specialist: [phone] [place] [phone]), M-F,  8am-6pm EST.   Hemoglobin A1c     Status: None   Collection Time: 04/24/17  9:40 AM  Result Value Ref Range   Hgb A1c MFr Bld 5.3 4.6 - 6.5 %    Comment: Glycemic Control Guidelines for People with Diabetes:Non Diabetic:  <6%Goal of Therapy: <7%Additional Action Suggested:  >8%     Assessment/Plan: Bipolar I disorder ([place]) Not tolerating Abilify well with deterioration in mood. Wants to go back to Lamictal that worked  well for mood and work harder on diet and exercise. Will decrease Abilify to 1/2 tablet daily for 1 week, then 1/2 tablet every other day for 1 week before completely stopping. At same time will start at 25 mg Lamictal daily for 1 week, then increase to 50 mg daily. Will go up to 100 mg after week 3 if tolerating. Close follow-up scheduled.     Leeanne Rio, PA-C

## 2017-05-21 NOTE — Assessment & Plan Note (Signed)
Not tolerating Abilify well with deterioration in mood. Wants to go back to Lamictal that worked well for mood and work harder on diet and exercise. Will decrease Abilify to 1/2 tablet daily for 1 week, then 1/2 tablet every other day for 1 week before completely stopping. At same time will start at 25 mg Lamictal daily for 1 week, then increase to 50 mg daily. Will go up to 100 mg after week 3 if tolerating. Close follow-up scheduled.

## 2017-06-05 ENCOUNTER — Other Ambulatory Visit: Payer: Self-pay | Admitting: Physician Assistant

## 2017-06-05 DIAGNOSIS — F900 Attention-deficit hyperactivity disorder, predominantly inattentive type: Secondary | ICD-10-CM

## 2017-06-05 DIAGNOSIS — F411 Generalized anxiety disorder: Secondary | ICD-10-CM

## 2017-06-05 MED ORDER — ALPRAZOLAM 0.5 MG PO TABS: 0.5000 mg | ORAL_TABLET | Freq: Every day | ORAL | 0 refills | Status: DC

## 2017-06-05 MED ORDER — AMPHETAMINE-DEXTROAMPHETAMINE 20 MG PO TABS: 20.0000 mg | ORAL_TABLET | Freq: Two times a day (BID) | ORAL | 0 refills | Status: DC

## 2017-06-05 NOTE — Telephone Encounter (Signed)
Refill request Xanax last rx 01/18/17 #30 Adderall last rx 04/23/17 #60  CSC: 02/25/17 UDS: 04/23/17 LOV: 05/20/17  Please advise

## 2017-06-20 ENCOUNTER — Encounter: Payer: Self-pay | Admitting: Physician Assistant

## 2017-06-21 MED ORDER — LORCASERIN HCL 10 MG PO TABS: 10.0000 mg | ORAL_TABLET | Freq: Two times a day (BID) | ORAL | 2 refills | Status: DC

## 2017-06-21 NOTE — Addendum Note (Signed)
Addended by: Brunetta Jeans on: 06/21/2017 10:21 AM   Modules accepted: Orders

## 2017-06-28 ENCOUNTER — Telehealth: Payer: Self-pay | Admitting: Emergency Medicine

## 2017-06-28 NOTE — Telephone Encounter (Signed)
LMOVM advising patient insurance doesn't cover the Belviq. See if patient insurance formulary covers any weight loss medication. If not then per PCP can place a referral to Weight Loss specialist.    Prior authorization started thru Cover My Meds and medication denied due to not covered by patient insurance.  No weight loss medication on formulary.

## 2017-07-02 ENCOUNTER — Telehealth: Payer: Self-pay | Admitting: Physician Assistant

## 2017-07-02 ENCOUNTER — Encounter: Payer: Self-pay | Admitting: Physician Assistant

## 2017-07-02 DIAGNOSIS — E669 Obesity, unspecified: Secondary | ICD-10-CM | POA: Insufficient documentation

## 2017-07-02 NOTE — Telephone Encounter (Signed)
[employer] [facility] faxed over Prior authorization for the Belviq. Form is in your folder to be completed.

## 2017-07-02 NOTE — Telephone Encounter (Signed)
Forms completed and signed. Ready to be faxed in.  Forms given to CMA.

## 2017-07-02 NOTE — Telephone Encounter (Signed)
Prior authorization form faxed. Waiting on response of insurance company.

## 2017-07-02 NOTE — Telephone Encounter (Signed)
Received paperwork from [employer], via [employer]. Requesting that forms be reviewed, completed and faxed back. Placed in front basket with charge sheet.

## 2017-07-03 NOTE — Telephone Encounter (Signed)
Patient insurance [employer] [facility] prior authorization for Belviq Denied due to medication are excluded from plan.

## 2017-07-04 NOTE — Telephone Encounter (Signed)
LMOVM advising patient her insurance does not cover the Belviq. She would have to pay out of pocket for medication. PCP is agreeable with referral to weight loss specialist at [facility] if she is agreeable.

## 2017-07-10 ENCOUNTER — Encounter: Payer: Self-pay | Admitting: Physician Assistant

## 2017-07-10 NOTE — Telephone Encounter (Signed)
Prior authorization denied again for [facility]

## 2017-07-10 NOTE — Telephone Encounter (Signed)
Did another PA on Cover My Meds for the Belviq.

## 2017-07-11 ENCOUNTER — Other Ambulatory Visit: Payer: Self-pay | Admitting: Physician Assistant

## 2017-07-11 DIAGNOSIS — F900 Attention-deficit hyperactivity disorder, predominantly inattentive type: Secondary | ICD-10-CM

## 2017-07-11 MED ORDER — AMPHETAMINE-DEXTROAMPHETAMINE 20 MG PO TABS: 20.0000 mg | ORAL_TABLET | Freq: Two times a day (BID) | ORAL | 0 refills | Status: DC

## 2017-07-11 NOTE — Telephone Encounter (Signed)
Last ov: 05/20/2017 Fast fill: 06/05/2017

## 2017-08-30 ENCOUNTER — Other Ambulatory Visit: Payer: Self-pay | Admitting: Physician Assistant

## 2017-09-05 ENCOUNTER — Other Ambulatory Visit: Payer: Self-pay | Admitting: Physician Assistant

## 2017-09-05 ENCOUNTER — Other Ambulatory Visit: Payer: Self-pay | Admitting: Family Medicine

## 2017-09-05 DIAGNOSIS — F411 Generalized anxiety disorder: Secondary | ICD-10-CM

## 2017-09-05 DIAGNOSIS — F900 Attention-deficit hyperactivity disorder, predominantly inattentive type: Secondary | ICD-10-CM

## 2017-09-05 NOTE — Telephone Encounter (Signed)
Last OV 05/20/17 adderall last filled 07/11/17 #60 with 0

## 2017-09-05 NOTE — Telephone Encounter (Signed)
Last OV 05/20/17 Alprazolam last filled 06/05/17 #30 with 0

## 2017-09-06 MED ORDER — ALPRAZOLAM 0.5 MG PO TABS: 0.5000 mg | ORAL_TABLET | Freq: Every day | ORAL | 0 refills | Status: DC

## 2017-09-06 MED ORDER — AMPHETAMINE-DEXTROAMPHETAMINE 20 MG PO TABS: 20.0000 mg | ORAL_TABLET | Freq: Two times a day (BID) | ORAL | 0 refills | Status: DC

## 2017-10-23 ENCOUNTER — Other Ambulatory Visit: Payer: Self-pay | Admitting: Physician Assistant

## 2017-10-23 DIAGNOSIS — F900 Attention-deficit hyperactivity disorder, predominantly inattentive type: Secondary | ICD-10-CM

## 2017-10-23 MED ORDER — AMPHETAMINE-DEXTROAMPHETAMINE 20 MG PO TABS: 20.0000 mg | ORAL_TABLET | Freq: Two times a day (BID) | ORAL | 0 refills | Status: DC

## 2017-10-23 NOTE — Telephone Encounter (Signed)
Last OV 05/20/17 adderall last filled 09/06/17 #60 with 0

## 2017-12-10 ENCOUNTER — Other Ambulatory Visit: Payer: Self-pay | Admitting: Physician Assistant

## 2017-12-11 NOTE — Telephone Encounter (Signed)
Last OV 05/20/17 lamictal last filled 08/30/17 #90 with 0

## 2017-12-18 ENCOUNTER — Other Ambulatory Visit: Payer: Self-pay | Admitting: Physician Assistant

## 2017-12-18 DIAGNOSIS — F900 Attention-deficit hyperactivity disorder, predominantly inattentive type: Secondary | ICD-10-CM

## 2017-12-18 DIAGNOSIS — F411 Generalized anxiety disorder: Secondary | ICD-10-CM

## 2017-12-18 NOTE — Telephone Encounter (Signed)
Last OV 05/20/17 adderall last filled 10/23/17 #60 with 0 Alprazolam last filled 09/06/2017 #30 with 0 lamictal last filled 12/11/17 #30 with 0

## 2018-01-06 ENCOUNTER — Other Ambulatory Visit: Payer: Self-pay | Admitting: Physician Assistant

## 2018-01-06 DIAGNOSIS — F900 Attention-deficit hyperactivity disorder, predominantly inattentive type: Secondary | ICD-10-CM

## 2018-01-06 NOTE — Telephone Encounter (Signed)
Copied from [place] [id number]. Topic: Quick Communication - Rx Refill/Question >> Jan 06, 2018  3:30 PM [place], [place] D wrote: Medication: amphetamine-dextroamphetamine (ADDERALL) 20 MG tablet / Pt stated that initial request was denied. Needs to know if she needs to have an OV and if so could 1 month refill be sent in prior to [place]. Please advise pt. Stated she has 6 pills left.  Has the patient contacted their pharmacy? Yes.   (Agent: If no, request that the patient contact the pharmacy for the refill.) (Agent: If yes, when and what did the pharmacy advise?)  Preferred Pharmacy (with phone number or street name): [facility] [place], [place] - [address] [place] [address] [place] [address] [place] [address] [phone] (Phone) [phone] (Fax)    Agent: Please be advised that RX refills may take up to 3 business days. We ask that you follow-up with your pharmacy.

## 2018-01-07 ENCOUNTER — Encounter: Payer: Self-pay | Admitting: Emergency Medicine

## 2018-01-07 NOTE — Telephone Encounter (Signed)
Patient is over due for an appointment. My chart message sent to patient

## 2018-01-08 MED ORDER — AMPHETAMINE-DEXTROAMPHETAMINE 20 MG PO TABS: 20.0000 mg | ORAL_TABLET | Freq: Two times a day (BID) | ORAL | 0 refills | Status: DC

## 2018-01-08 NOTE — Telephone Encounter (Signed)
CSC: 02/25/17 UDS: 02/25/17 Last OV 05/20/17 No appointment scheduled yet. Please advise

## 2018-01-08 NOTE — Telephone Encounter (Signed)
LMOVM advising patient a limited supply of Adderall was sent to the pharmacy and patient needs to schedule an follow up for ADHD and Bipolar disorder

## 2018-01-27 ENCOUNTER — Encounter: Payer: Self-pay | Admitting: Physician Assistant

## 2018-01-27 ENCOUNTER — Ambulatory Visit (INDEPENDENT_AMBULATORY_CARE_PROVIDER_SITE_OTHER): Payer: 59 | Admitting: Physician Assistant

## 2018-01-27 ENCOUNTER — Other Ambulatory Visit: Payer: Self-pay

## 2018-01-27 VITALS — BP 118/78 | HR 74 | Temp 98.2°F | Resp 16 | Ht 64.0 in | Wt 191.0 lb

## 2018-01-27 DIAGNOSIS — E669 Obesity, unspecified: Secondary | ICD-10-CM

## 2018-01-27 DIAGNOSIS — F319 Bipolar disorder, unspecified: Secondary | ICD-10-CM | POA: Diagnosis not present

## 2018-01-27 DIAGNOSIS — F900 Attention-deficit hyperactivity disorder, predominantly inattentive type: Secondary | ICD-10-CM

## 2018-01-27 NOTE — Patient Instructions (Signed)
Please go to the lab today for blood work.  I will call you with your results. We will alter treatment regimen(s) if indicated by your results.   Continue medications as directed for now. Can try to find a substitute for the Lamictal once things are stable.

## 2018-01-27 NOTE — Progress Notes (Signed)
Patient presents to clinic today for follow-up of Bipolar Depression and ADD.  Patient is currently on a regimen of Adderall 20 mg BID for her ADD. Has been well-maintained on this regimen for some time. Notes still taking medication as directed and tolerating well. Notes continued good focus.   In regards to mood, patient is still taking Lamictal as directed. Notes mood is very stable on this medication. Denies SI/HI, manic episodes. Denies breakthrough depressed mood with current regimen. Is overdue for repeat Lamictal level.  Past Medical History:  Diagnosis Date  . Anxiety   . Cancer ([place])    Melanoma skin cancer, ankle, calf, and back of leg  . Depression    Dr. Rachel Moulds  . GERD (gastroesophageal reflux disease)     Current Outpatient Medications on File Prior to Visit  Medication Sig Dispense Refill  . ALPRAZolam (XANAX) 0.5 MG tablet Take 1 tablet (0.5 mg total) by mouth at bedtime. 30 tablet 0  . amphetamine-dextroamphetamine (ADDERALL) 20 MG tablet Take 1 tablet (20 mg total) by mouth 2 (two) times daily. 30 tablet 0  . lamoTRIgine (LAMICTAL) 100 MG tablet Take 1 tablet (100 mg total) by mouth daily. No further Rx without follow-up. 30 tablet 0  . levonorgestrel (MIRENA) 20 MCG/24HR IUD 1 Intra Uterine Device (1 each total) by Intrauterine route once. 1 each 0  . Vitamin D, Ergocalciferol, (DRISDOL) 50000 units CAPS capsule Take 1 capsule (50,000 Units total) by mouth every 7 (seven) days. 12 capsule 0   No current facility-administered medications on file prior to visit.     Allergies  Allergen Reactions  . Codeine Nausea And Vomiting  . Depo-Medrol [Methylprednisolone Acetate] Hives    Family History  Problem Relation Age of Onset  . Lung cancer Father   . Hypertension Father   . Heart disease Father   . Alcohol abuse Neg Hx   . Diabetes Neg Hx   . Drug abuse Neg Hx   . Early death Neg Hx   . Hyperlipidemia Neg Hx   . Kidney disease Neg Hx   . Stroke  Neg Hx     Social History   Socioeconomic History  . Marital status: Married    Spouse name: Not on file  . Number of children: 3  . Years of education: Not on file  . Highest education level: Not on file  Occupational History  . Occupation: Counsellor: [employer]    Comment: [place]  . Financial resource strain: Not on file  . Food insecurity:    Worry: Not on file    Inability: Not on file  . Transportation needs:    Medical: Not on file    Non-medical: Not on file  Tobacco Use  . Smoking status: Former Smoker    Packs/day: 1.00    Years: 20.00    Pack years: 20.00    Types: Cigarettes    Last attempt to quit: 06/04/2012    Years since quitting: 5.6  . Smokeless tobacco: Never Used  . Tobacco comment: e cigs periodically  Substance and Sexual Activity  . Alcohol use: Yes    Alcohol/week: 2.0 standard drinks    Types: 2 Shots of liquor per week    Comment: socially  . Drug use: No  . Sexual activity: Yes    Birth control/protection: I.U.D.  Lifestyle  . Physical activity:    Days per week: Not on file    Minutes  per session: Not on file  . Stress: Not on file  Relationships  . Social connections:    Talks on phone: Not on file    Gets together: Not on file    Attends religious service: Not on file    Active member of club or organization: Not on file    Attends meetings of clubs or organizations: Not on file    Relationship status: Not on file  Other Topics Concern  . Not on file  Social History Narrative  . Not on file   Review of Systems - See HPI.  All other ROS are negative.  BP 118/78   Pulse 74   Temp 98.2 F (36.8 C) (Oral)   Resp 16   Ht 5\' 4"  (1.626 m)   Wt 191 lb (86.6 kg)   SpO2 98%   BMI 32.79 kg/m   Physical Exam Vitals signs reviewed.  Constitutional:      Appearance: Normal appearance.  HENT:     Head: Normocephalic and atraumatic.     Right Ear: Tympanic membrane normal.     Left Ear:  Tympanic membrane normal.     Nose: Nose normal.  Neck:     Musculoskeletal: Neck supple.  Cardiovascular:     Rate and Rhythm: Normal rate and regular rhythm.     Pulses: Normal pulses.     Heart sounds: Normal heart sounds.  Pulmonary:     Effort: Pulmonary effort is normal.     Breath sounds: Normal breath sounds.  Neurological:     Mental Status: She is alert.    Assessment/Plan: Bipolar I disorder ([place]) Doing very well. Continue current regimen. Will check lamictal level and thyroid levels.   ADHD (attention deficit hyperactivity disorder), inattentive type Doing very well. CSC on file. CS database reviewed. No red flags. Follow-up 3 months. Will obtain UDS at that time.  Obesity Dietary and exercise recommendations reviewed. Will check lipids and thyroid panel today.    Leeanne Rio, PA-C

## 2018-01-28 ENCOUNTER — Other Ambulatory Visit: Payer: Self-pay | Admitting: General Practice

## 2018-01-28 DIAGNOSIS — F900 Attention-deficit hyperactivity disorder, predominantly inattentive type: Secondary | ICD-10-CM

## 2018-01-28 LAB — T4, FREE: FREE T4: 0.9 ng/dL (ref 0.60–1.60)

## 2018-01-28 LAB — TSH: TSH: 3.37 u[IU]/mL (ref 0.35–4.50)

## 2018-01-28 MED ORDER — AMPHETAMINE-DEXTROAMPHETAMINE 20 MG PO TABS: 20.0000 mg | ORAL_TABLET | Freq: Two times a day (BID) | ORAL | 0 refills | Status: DC

## 2018-01-28 NOTE — Assessment & Plan Note (Signed)
Dietary and exercise recommendations reviewed. Will check lipids and thyroid panel today.

## 2018-01-28 NOTE — Telephone Encounter (Signed)
Called and gave pt her lab results. Pt advised that Einar Pheasant was going to be sending in her Adderall prescription. Last filled 01/08/18 #30 with 0 however pt takes 2 a day. Please advise?

## 2018-01-28 NOTE — Assessment & Plan Note (Signed)
Doing very well. CSC on file. CS database reviewed. No red flags. Follow-up 3 months. Will obtain UDS at that time.

## 2018-01-28 NOTE — Assessment & Plan Note (Signed)
Doing very well. Continue current regimen. Will check lamictal level and thyroid levels.

## 2018-02-03 LAB — TIQ-NTM

## 2018-02-04 LAB — LAMOTRIGINE LEVEL

## 2018-02-04 LAB — T3: T3 TOTAL: 139 ng/dL (ref 76–181)

## 2018-02-04 NOTE — Telephone Encounter (Signed)
The lab Lamotrigine (01/27/18) was unable to be tested. [facility] courier handled tube improperly. I have called Wendy Hawkins and LMOVM to schedule lab visit to redraw test.

## 2018-02-05 ENCOUNTER — Other Ambulatory Visit: Payer: Self-pay | Admitting: Physician Assistant

## 2018-02-06 ENCOUNTER — Other Ambulatory Visit: Payer: Self-pay | Admitting: Physician Assistant

## 2018-02-06 DIAGNOSIS — F319 Bipolar disorder, unspecified: Secondary | ICD-10-CM

## 2018-02-12 ENCOUNTER — Other Ambulatory Visit: Payer: Self-pay

## 2018-02-12 ENCOUNTER — Telehealth: Payer: Self-pay | Admitting: Physician Assistant

## 2018-02-12 DIAGNOSIS — F900 Attention-deficit hyperactivity disorder, predominantly inattentive type: Secondary | ICD-10-CM

## 2018-02-12 MED ORDER — AMPHETAMINE-DEXTROAMPHETAMINE 20 MG PO TABS: 20.0000 mg | ORAL_TABLET | Freq: Two times a day (BID) | ORAL | 0 refills | Status: DC

## 2018-02-12 NOTE — Telephone Encounter (Signed)
Copied from [place] [id number]. Topic: Quick Communication - Rx Refill/Question >> Feb 12, 2018 12:06 PM Scherrie Gerlach wrote: Medication: amphetamine-dextroamphetamine (ADDERALL) 20 MG tablet   2 / day  Pt states she has called several times.  Pt seen 02/06/2018 and states it was supposed to be filled at that time.  Now she is out.  [facility] [place], [place] - [address] [phone] (Phone) [phone] (Fax)

## 2018-02-12 NOTE — Telephone Encounter (Signed)
First notification I have received. Per her chart medication was sent to pharmacy on 01/28/18.I have resent to pharmacy for her.

## 2018-02-13 ENCOUNTER — Telehealth: Payer: Self-pay | Admitting: Physician Assistant

## 2018-02-13 ENCOUNTER — Other Ambulatory Visit: Payer: Self-pay | Admitting: Physician Assistant

## 2018-02-13 DIAGNOSIS — F900 Attention-deficit hyperactivity disorder, predominantly inattentive type: Secondary | ICD-10-CM

## 2018-02-13 MED ORDER — AMPHETAMINE-DEXTROAMPHETAMINE 20 MG PO TABS: 20.0000 mg | ORAL_TABLET | Freq: Two times a day (BID) | ORAL | 0 refills | Status: DC

## 2018-02-13 NOTE — Telephone Encounter (Signed)
Pharmacy has been notified to cancel last refill.

## 2018-02-13 NOTE — Telephone Encounter (Signed)
Copied from [place] [id number]. Topic: Quick Communication - See Telephone Encounter >> Feb 13, 2018 12:04 PM Vernona Rieger wrote: CRM for notification. See Telephone encounter for: 02/13/18. [facility] would like to Clarify if she is suppose to be taking 15 days or 30 days for amphetamine-dextroamphetamine (ADDERALL) 20 MG tablet. The script is only witting for 15 days. Please Advise

## 2018-02-13 NOTE — Telephone Encounter (Signed)
Please see note.

## 2018-02-13 NOTE — Telephone Encounter (Signed)
Rx written for #30,0. Patient takes 2 pills per day. Please Advise.

## 2018-02-13 NOTE — Telephone Encounter (Signed)
Should be 60 tablets for a 30 day supply. Have them cancel the current prescription. I will send an updated script to the pharmacy.

## 2018-02-19 ENCOUNTER — Other Ambulatory Visit: Payer: 59

## 2018-02-27 ENCOUNTER — Other Ambulatory Visit (INDEPENDENT_AMBULATORY_CARE_PROVIDER_SITE_OTHER): Payer: 59

## 2018-02-27 DIAGNOSIS — Z0000 Encounter for general adult medical examination without abnormal findings: Secondary | ICD-10-CM

## 2018-02-27 DIAGNOSIS — E669 Obesity, unspecified: Secondary | ICD-10-CM

## 2018-02-27 DIAGNOSIS — F319 Bipolar disorder, unspecified: Secondary | ICD-10-CM

## 2018-02-27 LAB — LIPID PANEL
Cholesterol: 151 mg/dL (ref 0–200)
HDL: 41.8 mg/dL (ref >39.00)
LDL Cholesterol: 99 mg/dL (ref 0–99)
NonHDL: 109.1
Total CHOL/HDL Ratio: 4
Triglycerides: 52 mg/dL (ref 0.0–149.0)
VLDL: 10.4 mg/dL (ref 0.0–40.0)

## 2018-02-27 LAB — COMPREHENSIVE METABOLIC PANEL
ALT: 14 U/L (ref 0–35)
AST: 16 U/L (ref 0–37)
Albumin: 4.2 g/dL (ref 3.5–5.2)
Alkaline Phosphatase: 56 U/L (ref 39–117)
BUN: 8 mg/dL (ref 6–23)
CO2: 29 mEq/L (ref 19–32)
CREATININE: 0.77 mg/dL (ref 0.40–1.20)
Calcium: 9.2 mg/dL (ref 8.4–10.5)
Chloride: 104 mEq/L (ref 96–112)
GFR: 82.43 mL/min (ref >60.00)
Glucose, Bld: 87 mg/dL (ref 70–99)
Potassium: 4.6 mEq/L (ref 3.5–5.1)
SODIUM: 139 meq/L (ref 135–145)
Total Bilirubin: 1.4 mg/dL — ABNORMAL HIGH (ref 0.2–1.2)
Total Protein: 6.3 g/dL (ref 6.0–8.3)

## 2018-02-27 LAB — HEMOGLOBIN A1C: Hgb A1c MFr Bld: 5.2 % (ref 4.6–6.5)

## 2018-02-27 NOTE — Addendum Note (Signed)
Addended by: Doran Clay A on: 02/27/2018 08:07 AM   Modules accepted: Orders

## 2018-03-03 LAB — LAMOTRIGINE LEVEL: Lamotrigine Lvl: 2.9 ug/mL — ABNORMAL LOW (ref 4.0–18.0)

## 2018-04-07 ENCOUNTER — Other Ambulatory Visit: Payer: Self-pay | Admitting: Physician Assistant

## 2018-04-07 DIAGNOSIS — F411 Generalized anxiety disorder: Secondary | ICD-10-CM

## 2018-04-07 DIAGNOSIS — F900 Attention-deficit hyperactivity disorder, predominantly inattentive type: Secondary | ICD-10-CM

## 2018-04-07 NOTE — Telephone Encounter (Signed)
LOV: 01/27/18 Last rx for Xanax 09/06/17 #30 Last rx for Adderall 02/13/18 #60 CSC: 02/25/17 UDS: 04/23/17  Patient is due for appointment

## 2018-04-08 ENCOUNTER — Encounter: Payer: Self-pay | Admitting: Emergency Medicine

## 2018-04-08 MED ORDER — ALPRAZOLAM 0.5 MG PO TABS: 0.5000 mg | ORAL_TABLET | Freq: Every day | ORAL | 0 refills | Status: DC

## 2018-04-08 MED ORDER — AMPHETAMINE-DEXTROAMPHETAMINE 20 MG PO TABS: 20.0000 mg | ORAL_TABLET | Freq: Two times a day (BID) | ORAL | 0 refills | Status: DC

## 2018-04-08 NOTE — Telephone Encounter (Signed)
RX refilled. No other Rx without follow-up appointment.

## 2018-04-08 NOTE — Telephone Encounter (Signed)
My chart message sent to patient that she will need a appointment for more refills

## 2018-05-20 ENCOUNTER — Encounter: Payer: Self-pay | Admitting: Emergency Medicine

## 2018-05-20 ENCOUNTER — Other Ambulatory Visit: Payer: Self-pay | Admitting: Physician Assistant

## 2018-05-20 DIAGNOSIS — F900 Attention-deficit hyperactivity disorder, predominantly inattentive type: Secondary | ICD-10-CM

## 2018-05-20 NOTE — Telephone Encounter (Signed)
My chart message sent to patient to schedule a Virtual Visit for ADHD.

## 2018-05-21 NOTE — Telephone Encounter (Signed)
LMOVM advising patient to call back to schedule an virtual visit for ADHD, Bipolar follow up/medication refill

## 2018-05-28 ENCOUNTER — Other Ambulatory Visit: Payer: Self-pay

## 2018-05-28 ENCOUNTER — Ambulatory Visit (INDEPENDENT_AMBULATORY_CARE_PROVIDER_SITE_OTHER): Payer: 59 | Admitting: Physician Assistant

## 2018-05-28 ENCOUNTER — Encounter: Payer: Self-pay | Admitting: Physician Assistant

## 2018-05-28 VITALS — Temp 98.6°F

## 2018-05-28 DIAGNOSIS — F900 Attention-deficit hyperactivity disorder, predominantly inattentive type: Secondary | ICD-10-CM | POA: Diagnosis not present

## 2018-05-28 NOTE — Assessment & Plan Note (Signed)
Stable. Doing very well with this despite reason changes in schedule. Continue current regimen. Follow-up in 6 months.

## 2018-05-28 NOTE — Progress Notes (Signed)
   Virtual Visit via Video   I connected with patient on 05/28/18 at  3:20 PM EDT by a video enabled telemedicine application and verified that I am speaking with the correct person using two identifiers.  Location patient: Home Location provider: [facility], Office Persons participating in the virtual visit: Patient, Provider, [place] Reymundo Poll)  I discussed the limitations of evaluation and management by telemedicine and the availability of in person appointments. The patient expressed understanding and agreed to proceed.  Subjective:   HPI:   Patient presents via Doxy.Me for follow-up ADHD. Patient is currently on a regimen of Adderall 20 mg BID. Is taking her medications as directed. 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 Notes stable mood and sleeping well. .  ROS:   See pertinent positives and negatives per HPI.  Patient Active Problem List   Diagnosis Date Noted  . Obesity 07/02/2017  . Visit for preventive health examination 04/23/2017  . 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 Vitamin D deficiency 04/14/2015  . 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 Bipolar I disorder ([place]) 08/27/2013  . 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 PTSD (post-traumatic stress disorder) 09/25/2012    Social History   Tobacco Use  . Smoking status: Former Smoker    Packs/day: 1.00    Years: 20.00    Pack years: 20.00    Types: Cigarettes    Last attempt to quit: 06/04/2012    Years since quitting: 5.9  . Smokeless tobacco: Never Used  . Tobacco comment: e cigs periodically  Substance Use Topics  . Alcohol use: Yes    Alcohol/week: 2.0 standard drinks    Types: 2 Shots of liquor per week    Comment: socially    Current Outpatient Medications:  .  ALPRAZolam (XANAX) 0.5 MG tablet, Take 1 tablet (0.5 mg total) by mouth at  bedtime., Disp: 30 tablet, Rfl: 0 .  amphetamine-dextroamphetamine (ADDERALL) 20 MG tablet, Take 1 tablet (20 mg total) by mouth 2 (two) times daily., Disp: 60 tablet, Rfl: 0 .  lamoTRIgine (LAMICTAL) 100 MG tablet, TAKE 1 TABLET BY MOUTH DAILY, Disp: 30 tablet, Rfl: 5 .  levonorgestrel (MIRENA) 20 MCG/24HR IUD, 1 Intra Uterine Device (1 each total) by Intrauterine route once., Disp: 1 each, Rfl: 0 .  Vitamin D, Ergocalciferol, (DRISDOL) 50000 units CAPS capsule, Take 1 capsule (50,000 Units total) by mouth every 7 (seven) days., Disp: 12 capsule, Rfl: 0  Allergies  Allergen Reactions  . Codeine Nausea And Vomiting  . Depo-Medrol [Methylprednisolone Acetate] Hives    Objective:   Temp 98.6 F (37 C) (Oral)   Patient is well-developed, well-nourished in no acute distress.  Resting comfortably at home.  Head is normocephalic, atraumatic.  No labored breathing.  Speech is clear and coherent with logical contest.  Patient is alert and oriented at baseline.   Assessment and Plan:    ADHD (attention deficit hyperactivity disorder), inattentive type Stable. Doing very well with this despite reason changes in schedule. Continue current regimen. Follow-up in 6 months.     Leeanne Rio, PA-C 05/28/2018

## 2018-06-04 ENCOUNTER — Other Ambulatory Visit: Payer: Self-pay | Admitting: Physician Assistant

## 2018-06-04 DIAGNOSIS — F900 Attention-deficit hyperactivity disorder, predominantly inattentive type: Secondary | ICD-10-CM

## 2018-06-04 MED ORDER — AMPHETAMINE-DEXTROAMPHETAMINE 20 MG PO TABS: 20.0000 mg | ORAL_TABLET | Freq: Two times a day (BID) | ORAL | 0 refills | Status: DC

## 2018-06-04 NOTE — Telephone Encounter (Signed)
Copied from [place] [id number]. Topic: Quick Communication - Rx Refill/Question >> Jun 04, 2018  3:09 PM Scherrie Gerlach wrote: Medication: amphetamine-dextroamphetamine (ADDERALL) 20 MG tablet  Pt had her virtual visit with Einar Pheasant, and thought her med would be called into the pharmacy.  But it is not there,and now pt states she is out and needs asap.  [facility] [place], [place] - [address] [phone] (Phone) [phone] (Fax)

## 2018-06-16 ENCOUNTER — Telehealth: Payer: Self-pay | Admitting: Physician Assistant

## 2018-06-16 NOTE — Telephone Encounter (Signed)
From completed and placed in bin for pick up by office staff. Patient will need to sign.

## 2018-06-16 NOTE — Telephone Encounter (Signed)
I have placed a physician form in the bin upfront with a charge sheet. Pt is aware that she needs to come in and sign the form before we can send it to [facility]. Pt can be reached at the home #

## 2018-06-17 NOTE — Telephone Encounter (Signed)
Received forms from back, LMOVM advising pt that forms have been completed, however we needed her signature before faxing. Place forms in file cabinet upfront asking pt to call office when she arrived so that we could bring forms to her.

## 2018-07-23 ENCOUNTER — Other Ambulatory Visit: Payer: Self-pay | Admitting: Physician Assistant

## 2018-07-23 DIAGNOSIS — F900 Attention-deficit hyperactivity disorder, predominantly inattentive type: Secondary | ICD-10-CM

## 2018-07-23 MED ORDER — AMPHETAMINE-DEXTROAMPHETAMINE 20 MG PO TABS: 20.0000 mg | ORAL_TABLET | Freq: Two times a day (BID) | ORAL | 0 refills | Status: DC

## 2018-07-23 NOTE — Telephone Encounter (Signed)
Last OV 05/28/18 Adderall last filled 06/04/18 #60 with 0

## 2018-09-03 ENCOUNTER — Other Ambulatory Visit: Payer: Self-pay | Admitting: Physician Assistant

## 2018-09-22 ENCOUNTER — Other Ambulatory Visit: Payer: Self-pay | Admitting: Physician Assistant

## 2018-09-22 DIAGNOSIS — F900 Attention-deficit hyperactivity disorder, predominantly inattentive type: Secondary | ICD-10-CM

## 2018-09-22 MED ORDER — AMPHETAMINE-DEXTROAMPHETAMINE 20 MG PO TABS: 20.0000 mg | ORAL_TABLET | Freq: Two times a day (BID) | ORAL | 0 refills | Status: DC

## 2018-09-22 NOTE — Telephone Encounter (Signed)
Last OV 05/28/18 adderall last filled 07/23/18 #60 with 0

## 2018-10-12 ENCOUNTER — Other Ambulatory Visit: Payer: Self-pay | Admitting: Physician Assistant

## 2018-10-21 ENCOUNTER — Other Ambulatory Visit: Payer: Self-pay | Admitting: Physician Assistant

## 2018-10-21 DIAGNOSIS — F900 Attention-deficit hyperactivity disorder, predominantly inattentive type: Secondary | ICD-10-CM

## 2018-10-21 MED ORDER — AMPHETAMINE-DEXTROAMPHETAMINE 20 MG PO TABS: 20.0000 mg | ORAL_TABLET | Freq: Two times a day (BID) | ORAL | 0 refills | Status: DC

## 2018-10-21 NOTE — Telephone Encounter (Signed)
Last OV 05/28/18 adderall last filled 09/22/18 #60 with 0

## 2018-11-06 ENCOUNTER — Other Ambulatory Visit: Payer: Self-pay | Admitting: Emergency Medicine

## 2018-11-06 DIAGNOSIS — F319 Bipolar disorder, unspecified: Secondary | ICD-10-CM

## 2018-11-06 MED ORDER — LAMOTRIGINE 100 MG PO TABS: 100.0000 mg | ORAL_TABLET | Freq: Every day | ORAL | 0 refills | Status: DC

## 2018-12-08 ENCOUNTER — Other Ambulatory Visit: Payer: Self-pay | Admitting: Physician Assistant

## 2018-12-08 DIAGNOSIS — F900 Attention-deficit hyperactivity disorder, predominantly inattentive type: Secondary | ICD-10-CM

## 2018-12-08 DIAGNOSIS — F319 Bipolar disorder, unspecified: Secondary | ICD-10-CM

## 2018-12-08 MED ORDER — LAMOTRIGINE 100 MG PO TABS: 100.0000 mg | ORAL_TABLET | Freq: Every day | ORAL | 0 refills | Status: DC

## 2018-12-08 MED ORDER — AMPHETAMINE-DEXTROAMPHETAMINE 20 MG PO TABS: 20.0000 mg | ORAL_TABLET | Freq: Two times a day (BID) | ORAL | 0 refills | Status: DC

## 2018-12-08 NOTE — Telephone Encounter (Signed)
Last OV 05/28/18 adderall last filled 10/21/18 #60 with 0

## 2018-12-08 NOTE — Telephone Encounter (Signed)
Last OV 05/28/18 lamictal last filled 11/06/18 #90 with 0

## 2019-01-12 ENCOUNTER — Other Ambulatory Visit: Payer: Self-pay | Admitting: Physician Assistant

## 2019-01-12 ENCOUNTER — Encounter: Payer: Self-pay | Admitting: Emergency Medicine

## 2019-01-12 DIAGNOSIS — F319 Bipolar disorder, unspecified: Secondary | ICD-10-CM

## 2019-02-06 ENCOUNTER — Ambulatory Visit (INDEPENDENT_AMBULATORY_CARE_PROVIDER_SITE_OTHER): Payer: 59 | Admitting: Physician Assistant

## 2019-02-06 ENCOUNTER — Other Ambulatory Visit: Payer: Self-pay

## 2019-02-06 ENCOUNTER — Encounter: Payer: Self-pay | Admitting: Physician Assistant

## 2019-02-06 VITALS — Temp 97.6°F

## 2019-02-06 DIAGNOSIS — Z0000 Encounter for general adult medical examination without abnormal findings: Secondary | ICD-10-CM | POA: Diagnosis not present

## 2019-02-06 DIAGNOSIS — F900 Attention-deficit hyperactivity disorder, predominantly inattentive type: Secondary | ICD-10-CM

## 2019-02-06 DIAGNOSIS — E559 Vitamin D deficiency, unspecified: Secondary | ICD-10-CM

## 2019-02-06 DIAGNOSIS — F319 Bipolar disorder, unspecified: Secondary | ICD-10-CM | POA: Diagnosis not present

## 2019-02-06 NOTE — Progress Notes (Signed)
I have discussed the procedure for the virtual visit with the patient who has given consent to proceed with assessment and treatment.   Aries Townley N Hardy Harcum, CMA      

## 2019-02-06 NOTE — Progress Notes (Signed)
Virtual Visit via Video   I connected with patient on 02/06/19 at  1:00 PM EST by a video enabled telemedicine application and verified that I am speaking with the correct person using two identifiers.  Location patient: Home Location provider: [facility], Office Persons participating in the virtual visit: Patient, Provider, [place] Janett Billow [place])  I discussed the limitations of evaluation and management by telemedicine and the availability of in person appointments. The patient expressed understanding and agreed to proceed.  Subjective:   HPI:   Patient presents via doxy.me today for CPE.  Denies acute concerns today. Has been exercising at least 3 x per week and watching diet but still having issue with weight gain which is frustrating for her. Mood and focus are doing well with her current regimen. Is overdue for mammogram and PAP smear and is scheduling these with her GYN.   ROS:   See pertinent positives and negatives per HPI.  Patient Active Problem List   Diagnosis Date Noted  . Obesity 07/02/2017  . Visit for preventive health examination 04/23/2017  . 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 Vitamin D deficiency 04/14/2015  . 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 Bipolar I disorder ([place]) 08/27/2013  . 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 PTSD (post-traumatic stress disorder) 09/25/2012    Social History   Tobacco Use  . Smoking status: Former Smoker    Packs/day: 1.00    Years: 20.00    Pack years: 20.00    Types: Cigarettes    Quit date: 06/04/2012    Years since quitting: 6.6  . Smokeless tobacco: Never Used  . Tobacco comment: e cigs periodically  Substance Use Topics  . Alcohol use: Yes    Alcohol/week: 2.0 standard drinks    Types: 2 Shots of liquor per week    Comment: socially    Current Outpatient Medications:  .  ALPRAZolam (XANAX) 0.5 MG tablet, Take 1  tablet (0.5 mg total) by mouth at bedtime., Disp: 30 tablet, Rfl: 0 .  amphetamine-dextroamphetamine (ADDERALL) 20 MG tablet, Take 1 tablet (20 mg total) by mouth 2 (two) times daily., Disp: 60 tablet, Rfl: 0 .  lamoTRIgine (LAMICTAL) 100 MG tablet, Take 1 tablet (100 mg total) by mouth daily. Please schedule an appointment for more refills, Disp: 15 tablet, Rfl: 0 .  levonorgestrel (MIRENA) 20 MCG/24HR IUD, 1 Intra Uterine Device (1 each total) by Intrauterine route once., Disp: 1 each, Rfl: 0  Allergies  Allergen Reactions  . Codeine Nausea And Vomiting  . Depo-Medrol [Methylprednisolone Sodium Succ] Hives    Objective:   There were no vitals taken for this visit.  Patient is well-developed, well-nourished in no acute distress.  Resting comfortably at home.  Head is normocephalic, atraumatic.  No labored breathing.  Speech is clear and coherent with logical content.  Patient is alert and oriented at baseline.   Assessment and Plan:   1. Visit for preventive health examination Depression screen negative. Health Maintenance reviewed. Preventive schedule discussed and handout given in AVS. Lab appt scheduled for fasting labs.  - CBC w/Diff; Future - Comp Met (CMET); Future - Lipid Profile; Future - TSH; Future  2. Bipolar I disorder ([place]) Stable. Continue current regimen. Will recheck CBC and TSH. Discussed Lamictal as potential cause of weight gain. Will check labs to see if TSH or Vitamin D contributing. If negative, may attempt trial of alternative medication versus starting med for weight loss.  -  CBC w/Diff; Future - TSH; Future  3. ADHD (attention deficit hyperactivity disorder), inattentive type Stable. Continue current regimen.   4. Vitamin D deficiency - Vitamin D (25 hydroxy); Future     Leeanne Rio, PA-C 02/06/2019

## 2019-02-09 ENCOUNTER — Other Ambulatory Visit: Payer: Self-pay | Admitting: Physician Assistant

## 2019-02-09 DIAGNOSIS — F900 Attention-deficit hyperactivity disorder, predominantly inattentive type: Secondary | ICD-10-CM

## 2019-02-09 DIAGNOSIS — F411 Generalized anxiety disorder: Secondary | ICD-10-CM

## 2019-02-09 MED ORDER — ALPRAZOLAM 0.5 MG PO TABS: 0.5000 mg | ORAL_TABLET | Freq: Every day | ORAL | 0 refills | Status: DC

## 2019-02-09 MED ORDER — AMPHETAMINE-DEXTROAMPHETAMINE 20 MG PO TABS: 20.0000 mg | ORAL_TABLET | Freq: Two times a day (BID) | ORAL | 0 refills | Status: DC

## 2019-02-09 NOTE — Telephone Encounter (Signed)
Pt had CPE via Doxy on Friday 02/09/2019 with Einar Pheasant, states none of her refills were done and she is out. Needs Xanax and Adderall ( the pharmacy gave her the [place])  [facility] in [place]

## 2019-02-09 NOTE — Telephone Encounter (Signed)
Xanax last rx 04/08/18 #30 Adderall last rx 12/08/18 #60 LOV: 02/06/19 CPE virtual CSC: 02/25/17 UDS: 04/23/17

## 2019-02-09 NOTE — Telephone Encounter (Signed)
Meds have been refilled.

## 2019-02-10 NOTE — Telephone Encounter (Signed)
Pt notified, she will pickup today.

## 2019-02-20 ENCOUNTER — Ambulatory Visit (INDEPENDENT_AMBULATORY_CARE_PROVIDER_SITE_OTHER): Payer: 59

## 2019-02-20 ENCOUNTER — Other Ambulatory Visit: Payer: Self-pay

## 2019-02-20 DIAGNOSIS — F319 Bipolar disorder, unspecified: Secondary | ICD-10-CM | POA: Diagnosis not present

## 2019-02-20 DIAGNOSIS — E559 Vitamin D deficiency, unspecified: Secondary | ICD-10-CM

## 2019-02-20 DIAGNOSIS — Z0000 Encounter for general adult medical examination without abnormal findings: Secondary | ICD-10-CM

## 2019-02-20 NOTE — Addendum Note (Signed)
Addended by: Fritz Pickerel on: 02/20/2019 03:41 PM   Modules accepted: Orders

## 2019-02-21 LAB — COMPREHENSIVE METABOLIC PANEL
AG Ratio: 1.9 (calc) (ref 1.0–2.5)
ALT: 26 U/L (ref 6–29)
AST: 18 U/L (ref 10–30)
Albumin: 4.3 g/dL (ref 3.6–5.1)
Alkaline phosphatase (APISO): 61 U/L (ref 31–125)
BUN: 10 mg/dL (ref 7–25)
CO2: 25 mmol/L (ref 20–32)
Calcium: 9 mg/dL (ref 8.6–10.2)
Chloride: 104 mmol/L (ref 98–110)
Creat: 0.81 mg/dL (ref 0.50–1.10)
Globulin: 2.3 g/dL (calc) (ref 1.9–3.7)
Glucose, Bld: 92 mg/dL (ref 65–99)
Potassium: 3.7 mmol/L (ref 3.5–5.3)
Sodium: 140 mmol/L (ref 135–146)
Total Bilirubin: 1.3 mg/dL — ABNORMAL HIGH (ref 0.2–1.2)
Total Protein: 6.6 g/dL (ref 6.1–8.1)

## 2019-02-21 LAB — LIPID PANEL
Cholesterol: 171 mg/dL (ref <200)
HDL: 42 mg/dL — ABNORMAL LOW (ref >=50)
LDL Cholesterol (Calc): 105 mg/dL (calc) — ABNORMAL HIGH
Non-HDL Cholesterol (Calc): 129 mg/dL (calc) (ref <130)
Total CHOL/HDL Ratio: 4.1 (calc) (ref <5.0)
Triglycerides: 143 mg/dL (ref <150)

## 2019-02-21 LAB — VITAMIN D 25 HYDROXY (VIT D DEFICIENCY, FRACTURES): Vit D, 25-Hydroxy: 25 ng/mL — ABNORMAL LOW (ref 30–100)

## 2019-02-21 LAB — CBC WITH DIFFERENTIAL/PLATELET
Absolute Monocytes: 374 cells/uL (ref 200–950)
Basophils Absolute: 51 cells/uL (ref 0–200)
Basophils Relative: 0.6 %
Eosinophils Absolute: 153 cells/uL (ref 15–500)
Eosinophils Relative: 1.8 %
HCT: 39.5 % (ref 35.0–45.0)
Hemoglobin: 13.3 g/dL (ref 11.7–15.5)
Lymphs Abs: 2652 cells/uL (ref 850–3900)
MCH: 30.1 pg (ref 27.0–33.0)
MCHC: 33.7 g/dL (ref 32.0–36.0)
MCV: 89.4 fL (ref 80.0–100.0)
MPV: 10.9 fL (ref 7.5–12.5)
Monocytes Relative: 4.4 %
Neutro Abs: 5270 cells/uL (ref 1500–7800)
Neutrophils Relative %: 62 %
Platelets: 276 10*3/uL (ref 140–400)
RBC: 4.42 10*6/uL (ref 3.80–5.10)
RDW: 12.2 % (ref 11.0–15.0)
Total Lymphocyte: 31.2 %
WBC: 8.5 10*3/uL (ref 3.8–10.8)

## 2019-02-21 LAB — TSH: TSH: 1.25 mIU/L

## 2019-02-23 ENCOUNTER — Other Ambulatory Visit: Payer: Self-pay

## 2019-02-23 MED ORDER — VITAMIN D (ERGOCALCIFEROL) 1.25 MG (50000 UNIT) PO CAPS: 50000.0000 [IU] | ORAL_CAPSULE | ORAL | 2 refills | Status: DC

## 2019-03-21 ENCOUNTER — Other Ambulatory Visit: Payer: Self-pay

## 2019-03-21 ENCOUNTER — Emergency Department (HOSPITAL_COMMUNITY): Payer: 59

## 2019-03-21 ENCOUNTER — Encounter (HOSPITAL_COMMUNITY): Payer: Self-pay

## 2019-03-21 ENCOUNTER — Emergency Department (HOSPITAL_COMMUNITY)
Admission: EM | Admit: 2019-03-21 | Discharge: 2019-03-21 | Disposition: A | Discharge status: Home / Self Care | Payer: 59 | Attending: Emergency Medicine | Admitting: Emergency Medicine

## 2019-03-21 DIAGNOSIS — Z79899 Other long term (current) drug therapy: Secondary | ICD-10-CM | POA: Insufficient documentation

## 2019-03-21 DIAGNOSIS — W010XXA Fall on same level from slipping, tripping and stumbling without subsequent striking against object, initial encounter: Secondary | ICD-10-CM | POA: Insufficient documentation

## 2019-03-21 DIAGNOSIS — Z87891 Personal history of nicotine dependence: Secondary | ICD-10-CM | POA: Insufficient documentation

## 2019-03-21 DIAGNOSIS — Y9351 Activity, roller skating (inline) and skateboarding: Secondary | ICD-10-CM | POA: Insufficient documentation

## 2019-03-21 DIAGNOSIS — F900 Attention-deficit hyperactivity disorder, predominantly inattentive type: Secondary | ICD-10-CM | POA: Diagnosis not present

## 2019-03-21 DIAGNOSIS — S52124A Nondisplaced fracture of head of right radius, initial encounter for closed fracture: Secondary | ICD-10-CM

## 2019-03-21 DIAGNOSIS — Y92331 Roller skating rink as the place of occurrence of the external cause: Secondary | ICD-10-CM | POA: Insufficient documentation

## 2019-03-21 DIAGNOSIS — S59911A Unspecified injury of right forearm, initial encounter: Secondary | ICD-10-CM | POA: Diagnosis present

## 2019-03-21 DIAGNOSIS — Y998 Other external cause status: Secondary | ICD-10-CM | POA: Insufficient documentation

## 2019-03-21 DIAGNOSIS — Z85828 Personal history of other malignant neoplasm of skin: Secondary | ICD-10-CM | POA: Diagnosis not present

## 2019-03-21 MED ORDER — HYDROCODONE-ACETAMINOPHEN 5-325 MG PO TABS: 1.0000 | ORAL_TABLET | ORAL | 0 refills | Status: DC | PRN

## 2019-03-21 MED ORDER — HYDROCODONE-ACETAMINOPHEN 5-325 MG PO TABS
1.0000 | ORAL_TABLET | Freq: Once | ORAL | Status: AC
  Administered 2019-03-21: 1 via ORAL
  Filled 2019-03-21: qty 1

## 2019-03-21 NOTE — ED Provider Notes (Signed)
[facility] DEPT Provider Note   CSN: [id number] Arrival date & time: 03/21/19  2051     History Chief Complaint  Patient presents with  . Arm Pain    Wendy Hawkins is a 44 y.o. female.  Pt presents to the ED today with right elbow, wrist, and shoulder pain after a fall while roller skating.  Pt said she had a Painter type injury.  She said this occurred around 1700.  She is right hand dominant.        Past Medical History:  Diagnosis Date  . Anxiety   . Cancer ([place])    Melanoma skin cancer, ankle, calf, and back of leg  . Depression    Dr. Rachel Moulds  . GERD (gastroesophageal reflux disease)     Patient Active Problem List   Diagnosis Date Noted  . Obesity 07/02/2017  . Visit for preventive health examination 04/23/2017  . Vitamin D deficiency 04/14/2015  . 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 Bipolar I disorder ([place]) 08/27/2013  . 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 PTSD (post-traumatic stress disorder) 09/25/2012    Past Surgical History:  Procedure Laterality Date  . TONSILLECTOMY AND ADENOIDECTOMY    . WISDOM TOOTH EXTRACTION       OB History   No obstetric history on file.     Family History  Problem Relation Age of Onset  . Lung cancer Father   . Hypertension Father   . Heart disease Father   . Alcohol abuse Neg Hx   . Diabetes Neg Hx   . Drug abuse Neg Hx   . Early death Neg Hx   . Hyperlipidemia Neg Hx   . Kidney disease Neg Hx   . Stroke Neg Hx     Social History   Tobacco Use  . Smoking status: Former Smoker    Packs/day: 1.00    Years: 20.00    Pack years: 20.00    Types: Cigarettes    Quit date: 06/04/2012    Years since quitting: 6.7  . Smokeless tobacco: Never Used  . Tobacco comment: e cigs periodically  Substance Use Topics  . Alcohol use: Yes    Alcohol/week: 2.0 standard drinks    Types: 2 Shots of liquor per week    Comment: socially  . Drug  use: No    Home Medications Prior to Admission medications   Medication Sig Start Date End Date Taking? Authorizing Provider  ALPRAZolam Duanne Moron) 0.5 MG tablet Take 1 tablet (0.5 mg total) by mouth at bedtime. 02/09/19  Yes Brunetta Jeans, PA-C  amphetamine-dextroamphetamine (ADDERALL) 20 MG tablet Take 1 tablet (20 mg total) by mouth 2 (two) times daily. 02/09/19  Yes Brunetta Jeans, PA-C  lamoTRIgine (LAMICTAL) 100 MG tablet Take 1 tablet (100 mg total) by mouth daily. Please schedule an appointment for more refills 01/12/19  Yes Brunetta Jeans, PA-C  levonorgestrel (MIRENA) 20 MCG/24HR IUD 1 Intra Uterine Device (1 each total) by Intrauterine route once. 06/15/10  Yes Janith Lima, MD  Vitamin D, Ergocalciferol, (DRISDOL) 1.25 MG (50000 UNIT) CAPS capsule Take 1 capsule (50,000 Units total) by mouth every 7 (seven) days. Take one capsule (50,000 units) total by mouth every seven days for twelve weeks. 02/23/19  Yes Brunetta Jeans, PA-C  HYDROcodone-acetaminophen (NORCO/VICODIN) 5-325 MG tablet Take 1 tablet by mouth every 4 (four) hours as needed. 03/21/19   Isla Pence, MD    Allergies    Codeine  and Depo-medrol [methylprednisolone sodium succ]  Review of Systems   Review of Systems  Musculoskeletal:       Right wrist, right elbow, right shoulder pain  All other systems reviewed and are negative.   Physical Exam Updated Vital Signs BP 133/83 (BP Location: Left Arm)   Pulse 74   Temp 98 F (36.7 C) (Oral)   Resp 16   SpO2 100%   Physical Exam Vitals and nursing note reviewed.  Constitutional:      Appearance: Normal appearance.  HENT:     Head: Normocephalic and atraumatic.     Right Ear: External ear normal.     Left Ear: External ear normal.     Nose: Nose normal.     Mouth/Throat:     Mouth: Mucous membranes are moist.     Pharynx: Oropharynx is clear.  Eyes:     Extraocular Movements: Extraocular movements intact.     Conjunctiva/sclera: Conjunctivae  normal.     Pupils: Pupils are equal, round, and reactive to light.  Cardiovascular:     Rate and Rhythm: Normal rate and regular rhythm.     Pulses: Normal pulses.     Heart sounds: Normal heart sounds.  Pulmonary:     Effort: Pulmonary effort is normal.     Breath sounds: Normal breath sounds.  Abdominal:     General: Abdomen is flat. Bowel sounds are normal.     Palpations: Abdomen is soft.  Musculoskeletal:       Arms:     Cervical back: Normal range of motion and neck supple.  Skin:    General: Skin is warm.     Capillary Refill: Capillary refill takes less than 2 seconds.  Neurological:     General: No focal deficit present.     Mental Status: She is alert and oriented to person, place, and time.  Psychiatric:        Mood and Affect: Mood normal.        Behavior: Behavior normal.     ED Results / Procedures / Treatments   Labs (all labs ordered are listed, but only abnormal results are displayed) Labs Reviewed - No data to display  EKG None  Radiology DG Shoulder Right  Result Date: 03/21/2019 CLINICAL DATA:  Pain EXAM: RIGHT SHOULDER - 2+ VIEW COMPARISON:  None. FINDINGS: There is no evidence of fracture or dislocation. There is no evidence of arthropathy or other focal bone abnormality. Soft tissues are unremarkable. IMPRESSION: Negative. Electronically Signed   By: Constance Holster M.D.   On: 03/21/2019 21:53   DG Elbow Complete Right  Result Date: 03/21/2019 CLINICAL DATA:  Pain EXAM: RIGHT ELBOW - COMPLETE 3+ VIEW COMPARISON:  None. FINDINGS: There is an acute intra-articular fracture of the radial head without evidence for dislocation. There is a moderate-sized joint effusion. IMPRESSION: Acute intra-articular fracture of the radial head without evidence for dislocation. Electronically Signed   By: Constance Holster M.D.   On: 03/21/2019 21:54   DG Wrist Complete Right  Result Date: 03/21/2019 CLINICAL DATA:  Pain EXAM: RIGHT WRIST - COMPLETE 3+ VIEW  COMPARISON:  None. FINDINGS: There is a tiny osseous fragment adjacent to the ulnar styloid process that is well corticated and is felt to represent sequela of an old remote injury. There is no acute displaced fracture or dislocation identified on this study. IMPRESSION: No acute displaced fracture or dislocation. Electronically Signed   By: Constance Holster M.D.   On: 03/21/2019 21:54  DG Hand Complete Right  Result Date: 03/21/2019 CLINICAL DATA:  Pain EXAM: RIGHT HAND - COMPLETE 3+ VIEW COMPARISON:  None. FINDINGS: There is a well corticated osseous fragment adjacent to the ulnar styloid process that is favored to represent sequela of an old remote injury. There is no evidence for an acute displaced fracture or dislocation involving the right hand. The osseous mineralization is within normal limits. IMPRESSION: Negative. Electronically Signed   By: Constance Holster M.D.   On: 03/21/2019 21:55    Procedures Procedures (including critical care time)  Medications Ordered in ED Medications  HYDROcodone-acetaminophen (NORCO/VICODIN) 5-325 MG per tablet 1 tablet (has no administration in time range)    ED Course  I have reviewed the triage vital signs and the nursing notes.  Pertinent labs & imaging results that were available during my care of the patient were reviewed by me and considered in my medical decision making (see chart for details).    MDM Rules/Calculators/A&P                      Pt d/w Dr. Tamera Punt (ortho) who recommended a posterior splint and sling and close f/u in his office.     Final Clinical Impression(s) / ED Diagnoses Final diagnoses:  Closed nondisplaced fracture of head of right radius, initial encounter    Rx / DC Orders ED Discharge Orders         Ordered    HYDROcodone-acetaminophen (NORCO/VICODIN) 5-325 MG tablet  Every 4 hours PRN     03/21/19 2228           Isla Pence, MD 03/21/19 2230

## 2019-03-21 NOTE — ED Triage Notes (Signed)
t reports that she was rollerskating and fell. She caught herself with her R wrist. Bruising noted to palm of hand. She is able to move her wrist and fingers, but states that the pain moves up her arm and she is unable to move her elbow. A&Ox4. Denies head injury.

## 2019-03-21 NOTE — ED Notes (Signed)
Patient was verbalized discharge instructions. Pt had no further questions at this time. NAD. Patient awaiting family member to take her home.

## 2019-03-24 ENCOUNTER — Other Ambulatory Visit: Payer: Self-pay

## 2019-03-24 ENCOUNTER — Encounter: Payer: Self-pay | Admitting: Orthopaedic Surgery

## 2019-03-24 ENCOUNTER — Ambulatory Visit (INDEPENDENT_AMBULATORY_CARE_PROVIDER_SITE_OTHER): Payer: 59 | Admitting: Orthopaedic Surgery

## 2019-03-24 VITALS — Ht 65.0 in | Wt 185.0 lb

## 2019-03-24 DIAGNOSIS — S52124A Nondisplaced fracture of head of right radius, initial encounter for closed fracture: Secondary | ICD-10-CM

## 2019-03-24 NOTE — Progress Notes (Signed)
Office Visit Note   Patient: Wendy Hawkins           Date of Birth: [birth year]           MRN: [id number] Visit Date: 03/24/2019              Requested by: Brunetta Jeans, PA-C [address] [place],  [place] [address] PCP: Brunetta Jeans, PA-C   Assessment & Plan: Visit Diagnoses:  1. Closed nondisplaced fracture of head of right radius, initial encounter     Plan: Impression is nondisplaced right radial head fracture.  We will continue with nonoperative treatment.  Sling for up to 10 days and then begin gentle range of motion.  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  We will keep her out of work for 4 weeks to start with.  Recheck in 2 weeks with two-view x-rays of the right elbow.  Ibuprofen and Tylenol as needed for the pain.  Follow-Up Instructions: Return in about 2 weeks (around 04/07/2019).   Orders:  No orders of the defined types were placed in this encounter.  No orders of the defined types were placed in this encounter.     Procedures: No procedures performed   Clinical Data: No additional findings.   Subjective: Chief Complaint  Patient presents with  . Right Elbow - Pain    DOI 03/21/2019    Patient is a very pleasant 44 year old female who comes in for evaluation of the right radial head fracture from mechanical fall onto outstretched arm on 03/21/2019.  She presented to the [facility] long ER and x-rays demonstrated nondisplaced radial head fracture.  She was placed in a sling.  She also has wrist sprain.  X-rays were negative of the wrist and hand.  Denies any numbness and tingling.  She endorses pain with forearm supination and pronation.   Review of Systems  Constitutional: Negative.   HENT: Negative.   Eyes: Negative.   Respiratory: Negative.   Cardiovascular: Negative.   Endocrine: Negative.   Musculoskeletal: Negative.   Neurological:  Negative.   Hematological: Negative.   Psychiatric/Behavioral: Negative.   All other systems reviewed and are negative.    Objective: Vital Signs: Ht 5\' 5"  (1.651 m)   Wt 185 lb (83.9 kg)   BMI 30.79 kg/m   Physical Exam Vitals and nursing note reviewed.  Constitutional:      Appearance: She is well-developed.  HENT:     Head: Normocephalic and atraumatic.  Pulmonary:     Effort: Pulmonary effort is normal.  Abdominal:     Palpations: Abdomen is soft.  Musculoskeletal:     Cervical back: Neck supple.  Skin:    General: Skin is warm.     Capillary Refill: Capillary refill takes less than 2 seconds.  Neurological:     Mental Status: She is alert and oriented to person, place, and time.  Psychiatric:        Behavior: Behavior normal.        Thought Content: Thought content normal.        Judgment: Judgment normal.     Ortho Exam Right elbow exam shows slight tenderness to the radial head.  She has a decent range of motion with moderate pain.  Neurovascular intact distally. Specialty Comments:  No specialty comments available.  Imaging: No results found.   [place]  History: Patient Active Problem List   Diagnosis Date Noted  . Obesity 07/02/2017  . Visit for preventive health examination 04/23/2017  . Vitamin D deficiency 04/14/2015  . 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 Bipolar I disorder ([place]) 08/27/2013  . 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 PTSD (post-traumatic stress disorder) 09/25/2012   Past Medical History:  Diagnosis Date  . Anxiety   . Cancer ([place])    Melanoma skin cancer, ankle, calf, and back of leg  . Depression    Dr. Rachel Moulds  . GERD (gastroesophageal reflux disease)     Family History  Problem Relation Age of Onset  . Lung cancer Father   . Hypertension Father   . Heart disease Father   . Alcohol abuse Neg Hx   . Diabetes Neg Hx   . Drug abuse Neg Hx   . Early death Neg Hx   .  Hyperlipidemia Neg Hx   . Kidney disease Neg Hx   . Stroke Neg Hx     Past Surgical History:  Procedure Laterality Date  . TONSILLECTOMY AND ADENOIDECTOMY    . WISDOM TOOTH EXTRACTION     Social History   Occupational History  . 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 Smoking status: Former Smoker    Packs/day: 1.00    Years: 20.00    Pack years: 20.00    Types: Cigarettes    Quit date: 06/04/2012    Years since quitting: 6.8  . Smokeless tobacco: Never Used  . Tobacco comment: e cigs periodically  Substance and Sexual Activity  . Alcohol use: Yes    Alcohol/week: 2.0 standard drinks    Types: 2 Shots of liquor per week    Comment: socially  . Drug use: No  . Sexual activity: Yes    Birth control/protection: I.U.D.

## 2019-04-07 ENCOUNTER — Ambulatory Visit (INDEPENDENT_AMBULATORY_CARE_PROVIDER_SITE_OTHER): Payer: 59 | Admitting: Orthopaedic Surgery

## 2019-04-07 ENCOUNTER — Ambulatory Visit: Payer: Self-pay

## 2019-04-07 ENCOUNTER — Other Ambulatory Visit: Payer: Self-pay

## 2019-04-07 DIAGNOSIS — S52124A Nondisplaced fracture of head of right radius, initial encounter for closed fracture: Secondary | ICD-10-CM | POA: Diagnosis not present

## 2019-04-07 NOTE — Progress Notes (Signed)
Office Visit Note   Patient: Wendy Hawkins           Date of Birth: 07/27/1975           MRN: [id number] Visit Date: 04/07/2019              Requested by: Brunetta Jeans, PA-C [address] [place],  [place] [address] PCP: Brunetta Jeans, PA-C   Assessment & Plan: Visit Diagnoses:  1. Closed nondisplaced fracture of head of right radius, initial encounter     Plan: Impression is 2-week status post right radial head fracture.  At this point we will begin OT for range of motion.  Continue nonweightbearing.  Recheck in 4 weeks with two-view x-rays of the right elbow.  Follow-Up Instructions: Return in about 4 weeks (around 05/05/2019).   Orders:  Orders Placed This Encounter  Procedures  . XR Elbow 2 Views Right   No orders of the defined types were placed in this encounter.     Procedures: No procedures performed   Clinical Data: No additional findings.   Subjective: Chief Complaint  Patient presents with  . Right Elbow - Pain, Follow-up    Patient returns today 2 weeks status post nondisplaced radial head fracture.  She is improving some.  She still having some limitation range of motion.  Taking Advil daily.   Review of Systems   Objective: Vital Signs: There were no vitals taken for this visit.  Physical Exam  Ortho Exam Right elbow exam shows improvement in range of motion with moderate discomfort.  No significant swelling. Specialty Comments:  No specialty comments available.  Imaging: XR Elbow 2 Views Right  Result Date: 04/07/2019 Stable radial head fracture without any interval displacement.    PMFS History: Patient Active Problem List   Diagnosis Date Noted  . Obesity 07/02/2017  . Visit for preventive health examination 04/23/2017  . Vitamin D deficiency 04/14/2015  . 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 Bipolar I disorder ([place]) 08/27/2013  . 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 PTSD  (post-traumatic stress disorder) 09/25/2012   Past Medical History:  Diagnosis Date  . Anxiety   . Cancer ([place])    Melanoma skin cancer, ankle, calf, and back of leg  . Depression    Dr. Rachel Moulds  . GERD (gastroesophageal reflux disease)     Family History  Problem Relation Age of Onset  . Lung cancer Father   . Hypertension Father   . Heart disease Father   . Alcohol abuse Neg Hx   . Diabetes Neg Hx   . Drug abuse Neg Hx   . Early death Neg Hx   . Hyperlipidemia Neg Hx   . Kidney disease Neg Hx   . Stroke Neg Hx     Past Surgical History:  Procedure Laterality Date  . TONSILLECTOMY AND ADENOIDECTOMY    . WISDOM TOOTH EXTRACTION     Social History   Occupational History  . 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 Smoking status: Former Smoker    Packs/day: 1.00    Years: 20.00    Pack years: 20.00    Types: Cigarettes    Quit date: 06/04/2012    Years since quitting: 6.8  . Smokeless tobacco: Never Used  . Tobacco comment: e cigs periodically  Substance and Sexual Activity  . Alcohol use: Yes    Alcohol/week: 2.0  standard drinks    Types: 2 Shots of liquor per week    Comment: socially  . Drug use: No  . Sexual activity: Yes    Birth control/protection: I.U.D.

## 2019-04-10 ENCOUNTER — Other Ambulatory Visit: Payer: Self-pay | Admitting: Physician Assistant

## 2019-04-10 DIAGNOSIS — F319 Bipolar disorder, unspecified: Secondary | ICD-10-CM

## 2019-04-10 DIAGNOSIS — F900 Attention-deficit hyperactivity disorder, predominantly inattentive type: Secondary | ICD-10-CM

## 2019-04-10 MED ORDER — LAMOTRIGINE 100 MG PO TABS: 100.0000 mg | ORAL_TABLET | Freq: Every day | ORAL | 1 refills | Status: DC

## 2019-04-10 MED ORDER — AMPHETAMINE-DEXTROAMPHETAMINE 20 MG PO TABS: 20.0000 mg | ORAL_TABLET | Freq: Two times a day (BID) | ORAL | 0 refills | Status: DC

## 2019-04-10 NOTE — Telephone Encounter (Signed)
Lamictal last rx 01/12/19 #15 Lamictal level 02/27/18 2.9 low  Adderall last rx 02/09/19 #60 LOV: 02/06/19 CPE CSC: 02/25/17

## 2019-04-10 NOTE — Telephone Encounter (Signed)
Pt called in asking for a refill on the adderall and Lamictal to the [facility] in [place]

## 2019-04-13 ENCOUNTER — Other Ambulatory Visit: Payer: Self-pay | Admitting: Emergency Medicine

## 2019-04-13 DIAGNOSIS — Z79899 Other long term (current) drug therapy: Secondary | ICD-10-CM

## 2019-04-13 DIAGNOSIS — F319 Bipolar disorder, unspecified: Secondary | ICD-10-CM

## 2019-04-13 NOTE — Telephone Encounter (Signed)
Notified patient that she needed a lab appointment for Lamictal level. Last drawn 01/2018. Patient scheduled for 04/15/19. Patient also states she will bring in biometric form to be completed for CPE date 01/2019

## 2019-04-15 ENCOUNTER — Ambulatory Visit: Payer: 59

## 2019-04-16 ENCOUNTER — Telehealth: Payer: Self-pay | Admitting: Orthopaedic Surgery

## 2019-04-16 NOTE — Telephone Encounter (Signed)
Ok that's fine

## 2019-04-16 NOTE — Telephone Encounter (Signed)
Patient called. She would like a note to be out of work for 4 more weeks. Her call back number is [phone]

## 2019-04-17 ENCOUNTER — Telehealth: Payer: Self-pay | Admitting: Orthopaedic Surgery

## 2019-04-17 NOTE — Telephone Encounter (Signed)
Is this ok?

## 2019-04-17 NOTE — Telephone Encounter (Signed)
yes

## 2019-04-17 NOTE — Telephone Encounter (Signed)
Patient called and stated she only wanted 2 more week extension and not 4 weeks.  Please make correction and call patient to advise when she can pick upletter.  Please call patient to advise.  [phone]

## 2019-04-17 NOTE — Telephone Encounter (Signed)
Patient made appt for the 6th of April we will give her a new work note then.

## 2019-05-05 ENCOUNTER — Ambulatory Visit (INDEPENDENT_AMBULATORY_CARE_PROVIDER_SITE_OTHER): Payer: 59 | Admitting: Orthopaedic Surgery

## 2019-05-05 ENCOUNTER — Ambulatory Visit (INDEPENDENT_AMBULATORY_CARE_PROVIDER_SITE_OTHER): Payer: 59

## 2019-05-05 ENCOUNTER — Encounter: Payer: Self-pay | Admitting: Orthopaedic Surgery

## 2019-05-05 ENCOUNTER — Other Ambulatory Visit: Payer: Self-pay

## 2019-05-05 DIAGNOSIS — S52124A Nondisplaced fracture of head of right radius, initial encounter for closed fracture: Secondary | ICD-10-CM

## 2019-05-05 NOTE — Progress Notes (Signed)
Post-Op Visit Note   Patient: Wendy Hawkins           Date of Birth: February 18, 1975           MRN: [id number] Visit Date: 05/05/2019 PCP: Brunetta Jeans, PA-C   Assessment & Plan:  Chief Complaint:  Chief Complaint  Patient presents with  . Right Elbow - Pain   Visit Diagnoses:  1. Closed nondisplaced fracture of head of right radius, initial encounter     Plan: Patient is a pleasant 44 year old female who comes in today 6 weeks out nondisplaced right radial head fracture 2019-03-21.  She has been doing okay.  She had been progressing with range of motion in OT until yesterday.  She feels like she took a few steps behind and is not sure if this is possibly because of the holiday and only having 1 OT visit last week.  There has been no new injury or change in activity.  He is taking Advil for pain.  Examination of her right elbow reveals moderate tenderness to the fracture site.  She has near full flexion but lacks approximately 15 degrees of extension.  She has near full pronation and supination.  She is neurovascular intact distally.  At this point, she will continue working with OT.  She will continue to advance with activity as tolerated.  She will follow-up with [place] in 6 weeks time for repeat evaluation and 2 view x-rays of the right elbow.  Call with concerns or questions in the meantime.  Follow-Up Instructions: Return in about 6 weeks (around 06/16/2019).   Orders:  Orders Placed This Encounter  Procedures  . XR Elbow 2 Views Right   No orders of the defined types were placed in this encounter.   Imaging: XR Elbow 2 Views Right  Result Date: 05/05/2019 X-rays demonstrate continued healing of the fracture with evidence of bony consolidation   PMFS History: Patient Active Problem List   Diagnosis Date Noted  . Obesity 07/02/2017  . Visit for preventive health examination 04/23/2017  . Vitamin D deficiency 04/14/2015  . ADHD (attention deficit hyperactivity disorder),  inattentive type 08/27/2013  . Bipolar I disorder ([place]) 08/27/2013  . 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 PTSD (post-traumatic stress disorder) 09/25/2012   Past Medical History:  Diagnosis Date  . Anxiety   . Cancer ([place])    Melanoma skin cancer, ankle, calf, and back of leg  . Depression    Dr. Rachel Moulds  . GERD (gastroesophageal reflux disease)     Family History  Problem Relation Age of Onset  . Lung cancer Father   . Hypertension Father   . Heart disease Father   . Alcohol abuse Neg Hx   . Diabetes Neg Hx   . Drug abuse Neg Hx   . Early death Neg Hx   . Hyperlipidemia Neg Hx   . Kidney disease Neg Hx   . Stroke Neg Hx     Past Surgical History:  Procedure Laterality Date  . TONSILLECTOMY AND ADENOIDECTOMY    . WISDOM TOOTH EXTRACTION     Social History   Occupational History  . 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 Smoking status: Former Smoker    Packs/day: 1.00    Years: 20.00    Pack years: 20.00    Types: Cigarettes    Quit date: 06/04/2012    Years since quitting: 6.9  . Smokeless tobacco: Never  Used  . Tobacco comment: e cigs periodically  Substance and Sexual Activity  . Alcohol use: Yes    Alcohol/week: 2.0 standard drinks    Types: 2 Shots of liquor per week    Comment: socially  . Drug use: No  . Sexual activity: Yes    Birth control/protection: I.U.D.

## 2019-05-06 ENCOUNTER — Ambulatory Visit: Payer: 59 | Admitting: Orthopaedic Surgery

## 2019-06-16 ENCOUNTER — Encounter: Payer: Self-pay | Admitting: Orthopaedic Surgery

## 2019-06-16 ENCOUNTER — Other Ambulatory Visit: Payer: Self-pay

## 2019-06-16 ENCOUNTER — Ambulatory Visit (INDEPENDENT_AMBULATORY_CARE_PROVIDER_SITE_OTHER): Payer: 59 | Admitting: Orthopaedic Surgery

## 2019-06-16 ENCOUNTER — Ambulatory Visit: Payer: Self-pay

## 2019-06-16 DIAGNOSIS — S52124A Nondisplaced fracture of head of right radius, initial encounter for closed fracture: Secondary | ICD-10-CM

## 2019-06-16 NOTE — Progress Notes (Signed)
Office Visit Note   Patient: Wendy Hawkins           Date of Birth: [birth year]           MRN: [id number] Visit Date: 06/16/2019              Requested by: Brunetta Jeans, PA-C [address] [place],  [place] [id number] PCP: Brunetta Jeans, PA-C   Assessment & Plan: Visit Diagnoses:  1. Closed nondisplaced fracture of head of right radius, initial encounter     Plan: At this point we will set her up with a JAS brace to help achieve more range of motion as this is functionally limiting especially due to the lack of extension.  This is her dominant arm.  This has affected her ability to work as well.  I would like to recheck her in about 6 weeks.  Follow-Up Instructions: Return if symptoms worsen or fail to improve.   Orders:  Orders Placed This Encounter  Procedures  . XR Elbow 2 Views Right   No orders of the defined types were placed in this encounter.     Procedures: No procedures performed   Clinical Data: No additional findings.   Subjective: Chief Complaint  Patient presents with  . Right Elbow - Pain    Wendy Hawkins returns today for her right radial head fracture.  She is about 3 months from the injury.  She did couple times of PT but this was too expensive.  She has been working on range of motion at home.  She is limited due to lack of elbow extension.   Review of Systems   Objective: Vital Signs: There were no vitals taken for this visit.  Physical Exam  Ortho Exam Right elbow shows a range of motion of 25 to 132 degrees.  She has full pronation and supination. Specialty Comments:  No specialty comments available.  Imaging: XR Elbow 2 Views Right  Result Date: 06/16/2019 Healed radial head fracture    PMFS History: Patient Active Problem List   Diagnosis Date Noted  . Obesity 07/02/2017  . Visit for preventive health examination 04/23/2017  . Vitamin D deficiency 04/14/2015  . ADHD (attention deficit hyperactivity disorder),  inattentive type 08/27/2013  . Bipolar I disorder ([place]) 08/27/2013  . 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 PTSD (post-traumatic stress disorder) 09/25/2012   Past Medical History:  Diagnosis Date  . Anxiety   . Cancer ([place])    Melanoma skin cancer, ankle, calf, and back of leg  . Depression    Dr. Rachel Moulds  . GERD (gastroesophageal reflux disease)     Family History  Problem Relation Age of Onset  . Lung cancer Father   . Hypertension Father   . Heart disease Father   . Alcohol abuse Neg Hx   . Diabetes Neg Hx   . Drug abuse Neg Hx   . Early death Neg Hx   . Hyperlipidemia Neg Hx   . Kidney disease Neg Hx   . Stroke Neg Hx     Past Surgical History:  Procedure Laterality Date  . TONSILLECTOMY AND ADENOIDECTOMY    . WISDOM TOOTH EXTRACTION     Social History   Occupational History  . 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 Smoking status: Former Smoker    Packs/day: 1.00    Years: 20.00    Pack years: 20.00  Types: Cigarettes    Quit date: 06/04/2012    Years since quitting: 7.0  . Smokeless tobacco: Never Used  . Tobacco comment: e cigs periodically  Substance and Sexual Activity  . Alcohol use: Yes    Alcohol/week: 2.0 standard drinks    Types: 2 Shots of liquor per week    Comment: socially  . Drug use: No  . Sexual activity: Yes    Birth control/protection: I.U.D.

## 2019-06-25 ENCOUNTER — Other Ambulatory Visit: Payer: Self-pay | Admitting: Physician Assistant

## 2019-06-25 DIAGNOSIS — F900 Attention-deficit hyperactivity disorder, predominantly inattentive type: Secondary | ICD-10-CM

## 2019-06-25 DIAGNOSIS — F319 Bipolar disorder, unspecified: Secondary | ICD-10-CM

## 2019-06-25 NOTE — Telephone Encounter (Signed)
Pt called in asking for a adderall and lamictal, [facility] [place].   Pt asked if she needs to have bw done.   Pt can be reached at the home #

## 2019-06-26 ENCOUNTER — Other Ambulatory Visit: Payer: Self-pay | Admitting: Emergency Medicine

## 2019-06-26 DIAGNOSIS — F319 Bipolar disorder, unspecified: Secondary | ICD-10-CM

## 2019-06-26 MED ORDER — AMPHETAMINE-DEXTROAMPHETAMINE 20 MG PO TABS: 20.0000 mg | ORAL_TABLET | Freq: Two times a day (BID) | ORAL | 0 refills | Status: DC

## 2019-06-26 MED ORDER — LAMOTRIGINE 100 MG PO TABS: 100.0000 mg | ORAL_TABLET | Freq: Every day | ORAL | 0 refills | Status: DC

## 2019-06-26 NOTE — Telephone Encounter (Signed)
Adderall last rx 04/10/19 #60 Lamitcal last rx 04/10/19 #30 1 RF LOV: 02/06/19 CPE Last lamictal level was 01/2018

## 2019-06-26 NOTE — Telephone Encounter (Signed)
Patient advised of needing a lab appointment. Orders placed in chart

## 2019-07-01 ENCOUNTER — Other Ambulatory Visit: Payer: Self-pay

## 2019-07-01 ENCOUNTER — Ambulatory Visit (INDEPENDENT_AMBULATORY_CARE_PROVIDER_SITE_OTHER): Payer: 59

## 2019-07-01 DIAGNOSIS — F319 Bipolar disorder, unspecified: Secondary | ICD-10-CM | POA: Diagnosis not present

## 2019-07-02 LAB — CBC WITH DIFFERENTIAL/PLATELET
Basophils Absolute: 0.1 10*3/uL (ref 0.0–0.1)
Basophils Relative: 0.9 % (ref 0.0–3.0)
Eosinophils Absolute: 0.1 10*3/uL (ref 0.0–0.7)
Eosinophils Relative: 2.1 % (ref 0.0–5.0)
HCT: 37.9 % (ref 36.0–46.0)
Hemoglobin: 13.1 g/dL (ref 12.0–15.0)
Lymphocytes Relative: 37 % (ref 12.0–46.0)
Lymphs Abs: 2.6 10*3/uL (ref 0.7–4.0)
MCHC: 34.6 g/dL (ref 30.0–36.0)
MCV: 91.2 fl (ref 78.0–100.0)
Monocytes Absolute: 0.4 10*3/uL (ref 0.1–1.0)
Monocytes Relative: 5.5 % (ref 3.0–12.0)
Neutro Abs: 3.8 10*3/uL (ref 1.4–7.7)
Neutrophils Relative %: 54.5 % (ref 43.0–77.0)
Platelets: 133 10*3/uL — ABNORMAL LOW (ref 150.0–400.0)
RBC: 4.15 Mil/uL (ref 3.87–5.11)
RDW: 13.6 % (ref 11.5–15.5)
WBC: 7 10*3/uL (ref 4.0–10.5)

## 2019-07-02 LAB — TSH: TSH: 2.02 u[IU]/mL (ref 0.35–4.50)

## 2019-07-03 LAB — EXTRA SPECIMEN

## 2019-07-03 LAB — LAMOTRIGINE LEVEL

## 2019-07-09 ENCOUNTER — Ambulatory Visit: Payer: 59

## 2019-07-14 ENCOUNTER — Other Ambulatory Visit: Payer: Self-pay

## 2019-07-14 ENCOUNTER — Ambulatory Visit (INDEPENDENT_AMBULATORY_CARE_PROVIDER_SITE_OTHER): Payer: 59

## 2019-07-14 DIAGNOSIS — Z79899 Other long term (current) drug therapy: Secondary | ICD-10-CM | POA: Diagnosis not present

## 2019-07-17 LAB — LAMOTRIGINE LEVEL: Lamotrigine Lvl: 2.1 ug/mL — ABNORMAL LOW (ref 4.0–18.0)

## 2019-08-05 ENCOUNTER — Telehealth: Payer: Self-pay | Admitting: Physician Assistant

## 2019-08-05 ENCOUNTER — Other Ambulatory Visit: Payer: Self-pay | Admitting: Physician Assistant

## 2019-08-05 DIAGNOSIS — F900 Attention-deficit hyperactivity disorder, predominantly inattentive type: Secondary | ICD-10-CM

## 2019-08-05 DIAGNOSIS — F319 Bipolar disorder, unspecified: Secondary | ICD-10-CM

## 2019-08-05 MED ORDER — AMPHETAMINE-DEXTROAMPHETAMINE 20 MG PO TABS: 20.0000 mg | ORAL_TABLET | Freq: Two times a day (BID) | ORAL | 0 refills | Status: DC

## 2019-08-05 MED ORDER — LAMOTRIGINE 100 MG PO TABS: 100.0000 mg | ORAL_TABLET | Freq: Every day | ORAL | 0 refills | Status: DC

## 2019-08-05 NOTE — Telephone Encounter (Signed)
Patient would like her adderall called in to [facility] [address], [place].  Also needs the lomatrogine called in.

## 2019-08-05 NOTE — Telephone Encounter (Signed)
Adderall last rx 06/26/19 #60 Lamictal last rx 06/26/19 #30 LOV: 02/06/19 CPE Last Lamictal level on 07/14/19 was 2.1 CSC: 02/25/17

## 2019-08-05 NOTE — Telephone Encounter (Signed)
Duplicate note

## 2019-09-07 ENCOUNTER — Other Ambulatory Visit: Payer: Self-pay | Admitting: Physician Assistant

## 2019-09-07 DIAGNOSIS — F900 Attention-deficit hyperactivity disorder, predominantly inattentive type: Secondary | ICD-10-CM

## 2019-09-07 NOTE — Telephone Encounter (Signed)
Adderall last rx 08/05/19 #60 LOV: 02/06/19 CPE CSC: 02/25/17

## 2019-09-07 NOTE — Telephone Encounter (Signed)
Pt called in asking for a new script of the Adderall to be sent to the [facility] in [place]

## 2019-09-08 ENCOUNTER — Encounter: Payer: Self-pay | Admitting: Emergency Medicine

## 2019-09-08 ENCOUNTER — Other Ambulatory Visit: Payer: Self-pay | Admitting: Physician Assistant

## 2019-09-08 DIAGNOSIS — F900 Attention-deficit hyperactivity disorder, predominantly inattentive type: Secondary | ICD-10-CM

## 2019-09-08 MED ORDER — AMPHETAMINE-DEXTROAMPHETAMINE 20 MG PO TABS: 20.0000 mg | ORAL_TABLET | Freq: Two times a day (BID) | ORAL | 0 refills | Status: DC

## 2019-10-02 ENCOUNTER — Encounter: Payer: Self-pay | Admitting: Physician Assistant

## 2019-10-02 ENCOUNTER — Telehealth (INDEPENDENT_AMBULATORY_CARE_PROVIDER_SITE_OTHER): Payer: 59 | Admitting: Physician Assistant

## 2019-10-02 ENCOUNTER — Other Ambulatory Visit: Payer: Self-pay

## 2019-10-02 DIAGNOSIS — U071 COVID-19: Secondary | ICD-10-CM | POA: Diagnosis not present

## 2019-10-02 MED ORDER — ALBUTEROL SULFATE HFA 108 (90 BASE) MCG/ACT IN AERS: 2.0000 | INHALATION_SPRAY | Freq: Four times a day (QID) | RESPIRATORY_TRACT | 0 refills | Status: DC | PRN

## 2019-10-02 MED ORDER — BENZONATATE 100 MG PO CAPS: 100.0000 mg | ORAL_CAPSULE | Freq: Three times a day (TID) | ORAL | 0 refills | Status: DC | PRN

## 2019-10-02 NOTE — Progress Notes (Signed)
I have discussed the procedure for the virtual visit with the patient who has given consent to proceed with assessment and treatment.   Avabella Wailes S Aleyah Balik, CMA     

## 2019-10-02 NOTE — Progress Notes (Signed)
   Virtual Visit via Video   I connected with patient on 10/02/19 at  3:00 PM EDT by a video enabled telemedicine application and verified that I am speaking with the correct person using two identifiers.  Location patient: Home Location provider: [facility], Office Persons participating in the virtual visit: Patient, Provider, [place] (Patina Moore)  I discussed the limitations of evaluation and management by telemedicine and the availability of in person appointments. The patient expressed understanding and agreed to proceed.  Subjective:   HPI:   Patient presents via [place] today after being diagnosed earlier this week with COVID. Notes nasal congestion, cough, chest tightness, low-grade fever, fatigue and SOBOE. Denies any SOB at rest. Denies chest pain, lightheadedness or dizziness. Denies sinus pain, ear pain or tooth pain. Denies GI symptoms. Denies loss of taste or smell.   ROS:   See pertinent positives and negatives per HPI.  Patient Active Problem List   Diagnosis Date Noted  . Obesity 07/02/2017  . Visit for preventive health examination 04/23/2017  . Vitamin D deficiency 04/14/2015  . 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 Bipolar I disorder ([place]) 08/27/2013  . 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 PTSD (post-traumatic stress disorder) 09/25/2012    Social History   Tobacco Use  . Smoking status: Former Smoker    Packs/day: 1.00    Years: 20.00    Pack years: 20.00    Types: Cigarettes    Quit date: 06/04/2012    Years since quitting: 7.3  . Smokeless tobacco: Never Used  . Tobacco comment: e cigs periodically  Substance Use Topics  . Alcohol use: Yes    Alcohol/week: 2.0 standard drinks    Types: 2 Shots of liquor per week    Comment: socially    Current Outpatient Medications:  .  ALPRAZolam (XANAX) 0.5 MG tablet, Take 1 tablet (0.5 mg total) by mouth at bedtime., Disp: 30 tablet, Rfl: 0 .   amphetamine-dextroamphetamine (ADDERALL) 20 MG tablet, Take 1 tablet (20 mg total) by mouth 2 (two) times daily., Disp: 60 tablet, Rfl: 0 .  lamoTRIgine (LAMICTAL) 100 MG tablet, Take 1 tablet (100 mg total) by mouth daily., Disp: 90 tablet, Rfl: 0 .  levonorgestrel (MIRENA) 20 MCG/24HR IUD, 1 Intra Uterine Device (1 each total) by Intrauterine route once., Disp: 1 each, Rfl: 0  Allergies  Allergen Reactions  . Codeine Nausea And Vomiting  . Depo-Medrol [Methylprednisolone Sodium Succ] Hives    Objective:   There were no vitals taken for this visit.  Patient is well-developed, well-nourished in no acute distress.  Resting comfortably at home.  Head is normocephalic, atraumatic.  No labored breathing.  Speech is clear and coherent with logical content.  Patient is alert and oriented at baseline.   Assessment and Plan:   1. COVID-19 Diagnosed Wednesday. Family with COVID as well, husband improving. Daughters with mild symptoms. Patient continued with low-grade fever and SOBOE only. No windedness at rest. Is hydrating well. No chest pain or chest congestion. Will have her continue saline nasal rinse, hydration and rest. Begin Mucinex. Rx Tessalon and Albuterol MDI. Start 1000 mg Vitamin C, 1000 units D3 and an OTC zinc supplement daily. Patient enrolled in [place] monitoring program. Strict ER precautions reviewed with patient who voiced understanding and agreement with the plan.     Leeanne Rio, PA-C 10/02/2019

## 2019-10-02 NOTE — Patient Instructions (Signed)
Instructions sent to MyChart

## 2019-10-06 ENCOUNTER — Telehealth: Payer: Self-pay | Admitting: Physician Assistant

## 2019-10-06 NOTE — Telephone Encounter (Signed)
Patient states that she is still congested in her chest.  The mucinex does not seem to be helping - Please advise

## 2019-10-06 NOTE — Telephone Encounter (Signed)
Please advise 

## 2019-10-07 NOTE — Telephone Encounter (Signed)
Left detailed vm message with PCP recommendations as indicated on patient's DPR.

## 2019-10-07 NOTE — Telephone Encounter (Signed)
Would recommend Urgent Care assessment for lung examination and x-ray to make sure she does not have COVID pneumonia.

## 2019-11-09 ENCOUNTER — Other Ambulatory Visit: Payer: Self-pay | Admitting: Physician Assistant

## 2019-11-09 DIAGNOSIS — F319 Bipolar disorder, unspecified: Secondary | ICD-10-CM

## 2019-12-01 ENCOUNTER — Other Ambulatory Visit: Payer: Self-pay | Admitting: Physician Assistant

## 2019-12-01 DIAGNOSIS — F900 Attention-deficit hyperactivity disorder, predominantly inattentive type: Secondary | ICD-10-CM

## 2019-12-01 NOTE — Telephone Encounter (Signed)
Pt called in asking for a refill on the Adderall, pt uses [facility] in [place].  Please advise

## 2019-12-01 NOTE — Telephone Encounter (Signed)
Adderall last rx 09/08/19 #60 LOV: 10/02/19 Covid CSC: 02/25/17

## 2019-12-02 ENCOUNTER — Encounter: Payer: Self-pay | Admitting: Emergency Medicine

## 2019-12-02 MED ORDER — AMPHETAMINE-DEXTROAMPHETAMINE 20 MG PO TABS: 20.0000 mg | ORAL_TABLET | Freq: Two times a day (BID) | ORAL | 0 refills | Status: DC

## 2020-01-19 ENCOUNTER — Telehealth: Payer: Self-pay | Admitting: Physician Assistant

## 2020-01-19 ENCOUNTER — Other Ambulatory Visit: Payer: Self-pay

## 2020-01-19 DIAGNOSIS — F900 Attention-deficit hyperactivity disorder, predominantly inattentive type: Secondary | ICD-10-CM

## 2020-01-19 DIAGNOSIS — F411 Generalized anxiety disorder: Secondary | ICD-10-CM

## 2020-01-19 NOTE — Telephone Encounter (Signed)
Pt called in asking for a refill on the xanax and adderall, please send this to [facility] in [place].

## 2020-01-19 NOTE — Telephone Encounter (Signed)
Request sent to PCP for approval. 

## 2020-01-19 NOTE — Telephone Encounter (Signed)
Pt called in asking for a refill on the xanax and adderall, please send this to [facility] in [place]  LFD for xanax 02/09/19 #30 with no refills LFD for adderall 12/02/19 #60  with no refills LOV 10/02/19 NOV none

## 2020-01-20 MED ORDER — AMPHETAMINE-DEXTROAMPHETAMINE 20 MG PO TABS: 20.0000 mg | ORAL_TABLET | Freq: Two times a day (BID) | ORAL | 0 refills | Status: DC

## 2020-01-20 MED ORDER — ALPRAZOLAM 0.5 MG PO TABS: 0.5000 mg | ORAL_TABLET | Freq: Every day | ORAL | 0 refills | Status: DC

## 2020-03-03 ENCOUNTER — Other Ambulatory Visit: Payer: Self-pay | Admitting: Physician Assistant

## 2020-03-03 DIAGNOSIS — F319 Bipolar disorder, unspecified: Secondary | ICD-10-CM

## 2020-03-03 DIAGNOSIS — F900 Attention-deficit hyperactivity disorder, predominantly inattentive type: Secondary | ICD-10-CM

## 2020-03-03 NOTE — Telephone Encounter (Signed)
..  Medication Refills  Last OV:  Medication:  Lamotrigine and Adderall   Pharmacy:  [place]  Let patient know to contact pharmacy at the end of the day to make sure medication is ready.   Please notify patient to allow 48-72 hours to process.  Encourage patient to contact the pharmacy for refills or they can request refills through [place] out below:   Last refill:  QTY:  Refill Date:    Other Comments:   Okay for refill?  Please advise.

## 2020-03-04 NOTE — Telephone Encounter (Signed)
Adderall last rx 01/20/20 #60  Lamictal last rx 11/09/19 #90 LOV: 10/12/19 Covid  Last lamictal levels was 07/14/19

## 2020-03-05 MED ORDER — LAMOTRIGINE 100 MG PO TABS: ORAL_TABLET | ORAL | 0 refills | Status: DC

## 2020-03-05 MED ORDER — AMPHETAMINE-DEXTROAMPHETAMINE 20 MG PO TABS: 20.0000 mg | ORAL_TABLET | Freq: Two times a day (BID) | ORAL | 0 refills | Status: DC

## 2020-05-17 ENCOUNTER — Other Ambulatory Visit: Payer: Self-pay | Admitting: Physician Assistant

## 2020-05-17 DIAGNOSIS — F900 Attention-deficit hyperactivity disorder, predominantly inattentive type: Secondary | ICD-10-CM

## 2020-05-17 MED ORDER — AMPHETAMINE-DEXTROAMPHETAMINE 20 MG PO TABS: 20.0000 mg | ORAL_TABLET | Freq: Two times a day (BID) | ORAL | 0 refills | Status: DC

## 2020-05-17 NOTE — Telephone Encounter (Signed)
..  Medication Refills  Last OV:  Medication:  Adderall  Pharmacy:[facility] - [place]  Let patient know to contact pharmacy at the end of the day to make sure medication is ready.   Please notify patient to allow 48-72 hours to process.  Encourage patient to contact the pharmacy for refills or they can request refills through [place] out below:   Last refill:  QTY:  Refill Date:    Other Comments: Patient is completely out of medication and is making a toc appointment with Willia Craze for refill?  Please advise.

## 2020-05-17 NOTE — Telephone Encounter (Signed)
Patient is requesting a refill of the following medications: Requested Prescriptions   Pending Prescriptions Disp Refills  . amphetamine-dextroamphetamine (ADDERALL) 20 MG tablet 60 tablet 0    Sig: Take 1 tablet (20 mg total) by mouth 2 (two) times daily.    Date of patient request: 05/17/2020 Last office visit: 10/02/2019 Date of last refill: 03/05/2020 Last refill amount: 60 tablets Follow up time period per chart: 07/05/2020

## 2020-06-02 ENCOUNTER — Other Ambulatory Visit: Payer: Self-pay | Admitting: Family

## 2020-06-02 DIAGNOSIS — F319 Bipolar disorder, unspecified: Secondary | ICD-10-CM

## 2020-06-09 ENCOUNTER — Other Ambulatory Visit: Payer: Self-pay | Admitting: Family

## 2020-06-09 DIAGNOSIS — F319 Bipolar disorder, unspecified: Secondary | ICD-10-CM

## 2020-06-13 ENCOUNTER — Other Ambulatory Visit: Payer: Self-pay

## 2020-06-13 DIAGNOSIS — F319 Bipolar disorder, unspecified: Secondary | ICD-10-CM

## 2020-06-13 MED ORDER — LAMOTRIGINE 100 MG PO TABS: ORAL_TABLET | ORAL | 0 refills | Status: DC

## 2020-07-05 ENCOUNTER — Encounter: Payer: 59 | Admitting: Registered Nurse

## 2020-07-15 ENCOUNTER — Ambulatory Visit (INDEPENDENT_AMBULATORY_CARE_PROVIDER_SITE_OTHER): Payer: 59 | Admitting: Registered Nurse

## 2020-07-15 ENCOUNTER — Other Ambulatory Visit: Payer: Self-pay

## 2020-07-15 ENCOUNTER — Encounter: Payer: Self-pay | Admitting: Registered Nurse

## 2020-07-15 VITALS — BP 118/72 | HR 72 | Temp 98.3°F | Ht 65.0 in | Wt 173.2 lb

## 2020-07-15 DIAGNOSIS — F900 Attention-deficit hyperactivity disorder, predominantly inattentive type: Secondary | ICD-10-CM

## 2020-07-15 DIAGNOSIS — F4310 Post-traumatic stress disorder, unspecified: Secondary | ICD-10-CM

## 2020-07-15 DIAGNOSIS — F319 Bipolar disorder, unspecified: Secondary | ICD-10-CM | POA: Diagnosis not present

## 2020-07-15 MED ORDER — AMPHETAMINE-DEXTROAMPHETAMINE 20 MG PO TABS: 20.0000 mg | ORAL_TABLET | Freq: Two times a day (BID) | ORAL | 0 refills | Status: DC

## 2020-07-15 MED ORDER — LAMOTRIGINE 25 MG PO TABS: 25.0000 mg | ORAL_TABLET | Freq: Every day | ORAL | 0 refills | Status: DC

## 2020-07-15 NOTE — Patient Instructions (Signed)
Ms Ameenah Prosser to meet you  3 mo of refills have been sent  See you in August for follow up - Transfer of Care. We can discuss effect of lamictal increase at that time.  Of course, do not hesitate to reach out sooner if anything changes.   Thank you,  Rich

## 2020-07-15 NOTE — Progress Notes (Signed)
Established Patient Office Visit  Subjective:  Patient ID: Wendy Hawkins, female    DOB: 11/14/1975  Age: 45 y.o. MRN: [id number]  CC:  Chief Complaint  Patient presents with   Medication Refill    HPI Wendy Hawkins presents for medication refill  ADHD: Taking adderall 20mg  PO bid. Mostly M-F No AE. Tolerates very well.  Bipolar I Lamictal 25mg  po qd Good effect, no AE Hopes to continue  Histories reviewed and updated with patient.  Reviewed recent labs.  Past Medical History:  Diagnosis Date   Anxiety    Cancer ([place])    Melanoma skin cancer, ankle, calf, and back of leg   Depression    Dr. Rachel Moulds   GERD (gastroesophageal reflux disease)     Past Surgical History:  Procedure Laterality Date   TONSILLECTOMY AND ADENOIDECTOMY     WISDOM TOOTH EXTRACTION      Family History  Problem Relation Age of Onset   Lung cancer Father    Hypertension Father    Heart disease Father    Skin cancer Father    Alcohol abuse Neg Hx    Diabetes Neg Hx    Drug abuse Neg Hx    Early death Neg Hx    Hyperlipidemia Neg Hx    Kidney disease Neg Hx    Stroke Neg Hx     Social History   Socioeconomic History   Marital status: Married    Spouse name: Not on file   Number of children: 3   Years of education: Not on file   Highest education level: Not on file  Occupational History   Occupation: Counsellor: [employer]    Comment: [employer]  Tobacco Use   Smoking status: Former    Packs/day: 1.00    Years: 20.00    Pack years: 20.00    Types: Cigarettes    Quit date: 06/04/2012    Years since quitting: 8.4   Smokeless tobacco: Never   Tobacco comments:    e cigs periodically  Vaping Use   Vaping Use: Every day  Substance and Sexual Activity   Alcohol use: Yes    Alcohol/week: 2.0 standard drinks    Types: 2 Shots of liquor per week    Comment: socially   Drug use: Yes    Types: Marijuana   Sexual activity: Yes    Birth  control/protection: I.U.D.  Other Topics Concern   Not on file  Social History Narrative   Not on file   Social Determinants of Health   Financial Resource Strain: Not on file  Food Insecurity: Not on file  Transportation Needs: Not on file  Physical Activity: Not on file  Stress: Not on file  Social Connections: Not on file  Intimate Partner Violence: Not on file    Outpatient Medications Prior to Visit  Medication Sig Dispense Refill   albuterol (VENTOLIN HFA) 108 (90 Base) MCG/ACT inhaler Inhale 2 puffs into the lungs every 6 (six) hours as needed for wheezing or shortness of breath. (Patient not taking: Reported on 09/12/2020) 8 g 0   levonorgestrel (MIRENA) 20 MCG/24HR IUD 1 Intra Uterine Device (1 each total) by Intrauterine route once. 1 each 0   ALPRAZolam (XANAX) 0.5 MG tablet Take 1 tablet (0.5 mg total) by mouth at bedtime. 30 tablet 0   amphetamine-dextroamphetamine (ADDERALL) 20 MG tablet Take 1 tablet (20 mg total) by mouth 2 (two) times daily. 60 tablet 0  benzonatate (TESSALON) 100 MG capsule Take 1 capsule (100 mg total) by mouth 3 (three) times daily as needed for cough. 30 capsule 0   lamoTRIgine (LAMICTAL) 100 MG tablet TAKE 1 TABLET(100 MG) BY MOUTH DAILY 90 tablet 0   No facility-administered medications prior to visit.    Allergies  Allergen Reactions   Codeine Nausea And Vomiting   Depo-Medrol [Methylprednisolone Sodium Succ] Hives    ROS Review of Systems  Constitutional: Negative.   HENT: Negative.    Eyes: Negative.   Respiratory: Negative.    Cardiovascular: Negative.   Gastrointestinal: Negative.   Genitourinary: Negative.   Musculoskeletal: Negative.   Skin: Negative.   Neurological: Negative.   Psychiatric/Behavioral: Negative.    All other systems reviewed and are negative.    Objective:    Physical Exam Vitals and nursing note reviewed.  Constitutional:      General: She is not in acute distress.    Appearance: Normal  appearance. She is normal weight. She is not ill-appearing, toxic-appearing or diaphoretic.  Cardiovascular:     Rate and Rhythm: Normal rate and regular rhythm.     Heart sounds: Normal heart sounds. No murmur heard.   No friction rub. No gallop.  Pulmonary:     Effort: Pulmonary effort is normal. No respiratory distress.     Breath sounds: Normal breath sounds. No stridor. No wheezing, rhonchi or rales.  Chest:     Chest wall: No tenderness.  Skin:    General: Skin is warm and dry.  Neurological:     General: No focal deficit present.     Mental Status: She is alert and oriented to person, place, and time. Mental status is at baseline.  Psychiatric:        Mood and Affect: Mood normal.        Behavior: Behavior normal.        Thought Content: Thought content normal.        Judgment: Judgment normal.    BP 118/72   Pulse 72   Temp 98.3 F (36.8 C)   Ht 5\' 5"  (1.651 m)   Wt 173 lb 3.2 oz (78.6 kg)   SpO2 98%   BMI 28.82 kg/m  Wt Readings from Last 3 Encounters:  09/12/20 174 lb 12.8 oz (79.3 kg)  07/15/20 173 lb 3.2 oz (78.6 kg)  03/24/19 185 lb (83.9 kg)     Health Maintenance Due  Topic Date Due   COVID-19 Vaccine (1) Never done   HIV Screening  Never done   Hepatitis C Screening  Never done   MAMMOGRAM  02/07/2018   PAP SMEAR-Modifier  02/08/2020   INFLUENZA VACCINE  08/29/2020    There are no preventive care reminders to display for this patient.   Lab Results  Component Value Date   TSH 1.42 09/12/2020   Lab Results  Component Value Date   WBC 8.3 09/12/2020   HGB 13.1 09/12/2020   HCT 39.4 09/12/2020   MCV 92.1 09/12/2020   PLT 239.0 09/12/2020   Lab Results  Component Value Date   NA 140 09/12/2020   K 4.1 09/12/2020   CO2 29 09/12/2020   GLUCOSE 83 09/12/2020   BUN 7 09/12/2020   CREATININE 0.74 09/12/2020   BILITOT 1.2 09/12/2020   ALKPHOS 47 09/12/2020   AST 13 09/12/2020   ALT 14 09/12/2020   PROT 6.5 09/12/2020   ALBUMIN 4.4  09/12/2020   CALCIUM 9.2 09/12/2020   GFR 98.70 09/12/2020  Lab Results  Component Value Date   CHOL 160 09/12/2020   Lab Results  Component Value Date   HDL 50.20 09/12/2020   Lab Results  Component Value Date   LDLCALC 97 09/12/2020   Lab Results  Component Value Date   TRIG 63.0 09/12/2020   Lab Results  Component Value Date   CHOLHDL 3 09/12/2020   Lab Results  Component Value Date   HGBA1C 5.4 09/12/2020      Assessment & Plan:   Problem List Items Addressed This Visit       Other   PTSD (post-traumatic stress disorder)   ADHD (attention deficit hyperactivity disorder), inattentive type   Bipolar I disorder ([place]) - Primary    Meds ordered this encounter  Medications   DISCONTD: lamoTRIgine (LAMICTAL) 25 MG tablet    Sig: Take 1 tablet (25 mg total) by mouth daily.    Dispense:  90 tablet    Refill:  0    Order Specific Question:   Supervising Provider    Answer:   Carlota Raspberry, JEFFREY R [[id number]]   DISCONTD: amphetamine-dextroamphetamine (ADDERALL) 20 MG tablet    Sig: Take 1 tablet (20 mg total) by mouth 2 (two) times daily.    Dispense:  180 tablet    Refill:  0    Order Specific Question:   Supervising Provider    Answer:   Carlota Raspberry, JEFFREY R [[id number]]   DISCONTD: amphetamine-dextroamphetamine (ADDERALL) 20 MG tablet    Sig: Take 1 tablet (20 mg total) by mouth 2 (two) times daily.    Dispense:  60 tablet    Refill:  0    Order Specific Question:   Supervising Provider    Answer:   Carlota Raspberry, JEFFREY R [[id number]]    Follow-up: Return if symptoms worsen or fail to improve.   PLAN Pdmp consulted, no concerns. Refills as above Return in 6 mo, sooner with concerns Patient encouraged to call clinic with any questions, comments, or concerns.  Maximiano Coss, NP

## 2020-08-18 ENCOUNTER — Other Ambulatory Visit: Payer: Self-pay

## 2020-08-18 ENCOUNTER — Telehealth: Payer: Self-pay

## 2020-08-18 DIAGNOSIS — F4310 Post-traumatic stress disorder, unspecified: Secondary | ICD-10-CM

## 2020-08-18 DIAGNOSIS — F319 Bipolar disorder, unspecified: Secondary | ICD-10-CM

## 2020-08-18 DIAGNOSIS — F900 Attention-deficit hyperactivity disorder, predominantly inattentive type: Secondary | ICD-10-CM

## 2020-08-18 MED ORDER — AMPHETAMINE-DEXTROAMPHETAMINE 20 MG PO TABS: 20.0000 mg | ORAL_TABLET | Freq: Two times a day (BID) | ORAL | 0 refills | Status: DC

## 2020-08-18 MED ORDER — LAMOTRIGINE 100 MG PO TABS: ORAL_TABLET | ORAL | 0 refills | Status: DC

## 2020-08-18 MED ORDER — LAMOTRIGINE 25 MG PO TABS: 25.0000 mg | ORAL_TABLET | Freq: Every day | ORAL | 0 refills | Status: DC

## 2020-08-18 NOTE — Telephone Encounter (Signed)
Request sent to provider for approval.  

## 2020-08-18 NOTE — Telephone Encounter (Signed)
Pt needs refill on amphetamine-dextroamphetamine (ADDERALL) 20 MG tablet [[id number]]  lamoTRIgine (LAMICTAL) 100 MG tablet lamoTRIgine (LAMICTAL) 25 MG tablet  Pt is out of this smedication all together Pt is out of this medication all together and needs   Sent to [facility] [place], [place] - [address]   Adderall LFD 07/15/20 #60 with no refills Lamictal 100 mg LFD 06/13/20 #90 with no refills Lamictal 25mg  LFD 07/15/20 #90 with no refills LOV 07/15/20 NOV 09/12/20

## 2020-08-18 NOTE — Telephone Encounter (Signed)
Pt needs refill on amphetamine-dextroamphetamine (ADDERALL) 20 MG tablet [id number]  lamoTRIgine (LAMICTAL) 100 MG tablet lamoTRIgine (LAMICTAL) 25 MG tablet  Pt is out of this smedication all together  Pt is out of this medication all together and needs   Sent to [id number] - [place], [place] - [address]   Pt call back [phone]

## 2020-09-12 ENCOUNTER — Encounter: Payer: Self-pay | Admitting: Registered Nurse

## 2020-09-12 ENCOUNTER — Ambulatory Visit (INDEPENDENT_AMBULATORY_CARE_PROVIDER_SITE_OTHER): Payer: 59 | Admitting: Registered Nurse

## 2020-09-12 ENCOUNTER — Other Ambulatory Visit: Payer: Self-pay

## 2020-09-12 VITALS — BP 108/68 | HR 81 | Temp 98.3°F | Ht 65.0 in | Wt 174.8 lb

## 2020-09-12 DIAGNOSIS — Z130 Encounter for screening for diseases of the blood and blood-forming organs and certain disorders involving the immune mechanism: Secondary | ICD-10-CM | POA: Diagnosis not present

## 2020-09-12 DIAGNOSIS — F319 Bipolar disorder, unspecified: Secondary | ICD-10-CM

## 2020-09-12 DIAGNOSIS — E559 Vitamin D deficiency, unspecified: Secondary | ICD-10-CM | POA: Diagnosis not present

## 2020-09-12 DIAGNOSIS — F411 Generalized anxiety disorder: Secondary | ICD-10-CM

## 2020-09-12 DIAGNOSIS — Z13228 Encounter for screening for other metabolic disorders: Secondary | ICD-10-CM | POA: Diagnosis not present

## 2020-09-12 DIAGNOSIS — Z13220 Encounter for screening for lipoid disorders: Secondary | ICD-10-CM | POA: Diagnosis not present

## 2020-09-12 DIAGNOSIS — Z1329 Encounter for screening for other suspected endocrine disorder: Secondary | ICD-10-CM

## 2020-09-12 DIAGNOSIS — F4310 Post-traumatic stress disorder, unspecified: Secondary | ICD-10-CM

## 2020-09-12 DIAGNOSIS — L989 Disorder of the skin and subcutaneous tissue, unspecified: Secondary | ICD-10-CM

## 2020-09-12 DIAGNOSIS — Z85820 Personal history of malignant melanoma of skin: Secondary | ICD-10-CM

## 2020-09-12 DIAGNOSIS — F900 Attention-deficit hyperactivity disorder, predominantly inattentive type: Secondary | ICD-10-CM

## 2020-09-12 MED ORDER — AMPHETAMINE-DEXTROAMPHETAMINE 20 MG PO TABS: 20.0000 mg | ORAL_TABLET | Freq: Two times a day (BID) | ORAL | 0 refills | Status: DC

## 2020-09-12 MED ORDER — LAMOTRIGINE 25 MG PO TABS: 25.0000 mg | ORAL_TABLET | Freq: Every day | ORAL | 1 refills | Status: DC

## 2020-09-12 MED ORDER — ALPRAZOLAM 0.5 MG PO TABS: 0.5000 mg | ORAL_TABLET | Freq: Every day | ORAL | 2 refills | Status: DC

## 2020-09-12 MED ORDER — LAMOTRIGINE 100 MG PO TABS: ORAL_TABLET | ORAL | 1 refills | Status: DC

## 2020-09-12 NOTE — Patient Instructions (Signed)
Ms. Kis -   Doristine Devoid to see you  Glad things are going well  Refills x 6 mo  See you in February, sooner with concerns  Labs should be back tomorrow.  Thanks  [employer]

## 2020-09-12 NOTE — Progress Notes (Signed)
Established Patient Office Visit  Subjective:  Patient ID: Wendy Hawkins, female    DOB: 1975-04-22  Age: 45 y.o. MRN: [id number]  CC:  Chief Complaint  Patient presents with   Transitions Of Care   ADHD   Vitamin D Deficiency   PTSD    HPI Wendy Hawkins presents for [facility], med follow up  Doing well overall. Had increased lamictal to '125mg'$  po qd last visit Doing great, no AE  Adderall steady at '20mg'$  po bid  Skin lesion Right leg anterior. Well defined, border regular. Uniform color Concern as she has had a melanoma on her other leg Unfortunately unable to get into her past derm as she was unaware she had a bill unpaid Would like referral to [facility] to help her case that she has a skin issue.  Screening labs Would like to have routine labs done Has only had one small meal this morning Needs annual labs for insurance, plans to return with forms.  Past Medical History:  Diagnosis Date   Anxiety    Cancer ([place])    Melanoma skin cancer, ankle, calf, and back of leg   Depression    Dr. Rachel Moulds   GERD (gastroesophageal reflux disease)     Past Surgical History:  Procedure Laterality Date   TONSILLECTOMY AND ADENOIDECTOMY     WISDOM TOOTH EXTRACTION      Family History  Problem Relation Age of Onset   Lung cancer Father    Hypertension Father    Heart disease Father    Skin cancer Father    Alcohol abuse Neg Hx    Diabetes Neg Hx    Drug abuse Neg Hx    Early death Neg Hx    Hyperlipidemia Neg Hx    Kidney disease Neg Hx    Stroke Neg Hx     Social History   Socioeconomic History   Marital status: Married    Spouse name: Not on file   Number of children: 3   Years of education: Not on file   Highest education level: Not on file  Occupational History   Occupation: Counsellor: [employer]    Comment: [employer]  Tobacco Use   Smoking status: Former    Packs/day: 1.00    Years: 20.00    Pack years: 20.00     Types: Cigarettes    Quit date: 06/04/2012    Years since quitting: 8.2   Smokeless tobacco: Never   Tobacco comments:    e cigs periodically  Vaping Use   Vaping Use: Every day  Substance and Sexual Activity   Alcohol use: Yes    Alcohol/week: 2.0 standard drinks    Types: 2 Shots of liquor per week    Comment: socially   Drug use: Yes    Types: Marijuana   Sexual activity: Yes    Birth control/protection: I.U.D.  Other Topics Concern   Not on file  Social History Narrative   Not on file   Social Determinants of Health   Financial Resource Strain: Not on file  Food Insecurity: Not on file  Transportation Needs: Not on file  Physical Activity: Not on file  Stress: Not on file  Social Connections: Not on file  Intimate Partner Violence: Not on file    Outpatient Medications Prior to Visit  Medication Sig Dispense Refill   levonorgestrel (MIRENA) 20 MCG/24HR IUD 1 Intra Uterine Device (1 each total) by Intrauterine route once. 1 each  0   ALPRAZolam (XANAX) 0.5 MG tablet Take 1 tablet (0.5 mg total) by mouth at bedtime. 30 tablet 0   amphetamine-dextroamphetamine (ADDERALL) 20 MG tablet Take 1 tablet (20 mg total) by mouth 2 (two) times daily. 60 tablet 0   lamoTRIgine (LAMICTAL) 100 MG tablet TAKE 1 TABLET(100 MG) BY MOUTH DAILY 90 tablet 0   lamoTRIgine (LAMICTAL) 25 MG tablet Take 1 tablet (25 mg total) by mouth daily. 90 tablet 0   albuterol (VENTOLIN HFA) 108 (90 Base) MCG/ACT inhaler Inhale 2 puffs into the lungs every 6 (six) hours as needed for wheezing or shortness of breath. (Patient not taking: Reported on 09/12/2020) 8 g 0   No facility-administered medications prior to visit.    Allergies  Allergen Reactions   Codeine Nausea And Vomiting   Depo-Medrol [Methylprednisolone Sodium Succ] Hives    ROS Review of Systems  Constitutional: Negative.   HENT: Negative.    Eyes: Negative.   Respiratory: Negative.    Cardiovascular: Negative.   Gastrointestinal:  Negative.   Genitourinary: Negative.   Musculoskeletal: Negative.   Skin: Negative.  Negative for color change, pallor, rash and wound.       Lesion per hpi  Neurological: Negative.   Psychiatric/Behavioral: Negative.    All other systems reviewed and are negative.    Objective:    Physical Exam  BP 108/68   Pulse 81   Temp 98.3 F (36.8 C) (Temporal)   Ht '5\' 5"'$  (1.651 m)   Wt 174 lb 12.8 oz (79.3 kg)   LMP 07/31/2020 (Approximate)   SpO2 98%   BMI 29.09 kg/m  Wt Readings from Last 3 Encounters:  09/12/20 174 lb 12.8 oz (79.3 kg)  07/15/20 173 lb 3.2 oz (78.6 kg)  03/24/19 185 lb (83.9 kg)     Health Maintenance Due  Topic Date Due   Pneumococcal Vaccine 85-36 Years old (1 - PCV) Never done   HIV Screening  Never done   Hepatitis C Screening  Never done   MAMMOGRAM  02/07/2018   PAP SMEAR-Modifier  02/08/2020   INFLUENZA VACCINE  08/29/2020    There are no preventive care reminders to display for this patient.  Lab Results  Component Value Date   TSH 2.02 07/01/2019   Lab Results  Component Value Date   WBC 7.0 07/01/2019   HGB 13.1 07/01/2019   HCT 37.9 07/01/2019   MCV 91.2 07/01/2019   PLT 133.0 (L) 07/01/2019   Lab Results  Component Value Date   NA 140 02/20/2019   K 3.7 02/20/2019   CO2 25 02/20/2019   GLUCOSE 92 02/20/2019   BUN 10 02/20/2019   CREATININE 0.81 02/20/2019   BILITOT 1.3 (H) 02/20/2019   ALKPHOS 56 02/27/2018   AST 18 02/20/2019   ALT 26 02/20/2019   PROT 6.6 02/20/2019   ALBUMIN 4.2 02/27/2018   CALCIUM 9.0 02/20/2019   GFR 82.43 02/27/2018   Lab Results  Component Value Date   CHOL 171 02/20/2019   Lab Results  Component Value Date   HDL 42 (L) 02/20/2019   Lab Results  Component Value Date   LDLCALC 105 (H) 02/20/2019   Lab Results  Component Value Date   TRIG 143 02/20/2019   Lab Results  Component Value Date   CHOLHDL 4.1 02/20/2019   Lab Results  Component Value Date   HGBA1C 5.2 02/27/2018       Assessment & Plan:   Problem List Items Addressed This Visit  Other   PTSD (post-traumatic stress disorder)   Relevant Medications   lamoTRIgine (LAMICTAL) 25 MG tablet   ALPRAZolam (XANAX) 0.5 MG tablet   ADHD (attention deficit hyperactivity disorder), inattentive type   Relevant Medications   amphetamine-dextroamphetamine (ADDERALL) 20 MG tablet   amphetamine-dextroamphetamine (ADDERALL) 20 MG tablet (Start on 11/07/2020)   amphetamine-dextroamphetamine (ADDERALL) 20 MG tablet (Start on 10/10/2020)   Bipolar I disorder (HCC)   Relevant Medications   lamoTRIgine (LAMICTAL) 25 MG tablet   lamoTRIgine (LAMICTAL) 100 MG tablet   ALPRAZolam (XANAX) 0.5 MG tablet   Vitamin D deficiency - Primary   Relevant Orders   Vitamin D (25 hydroxy)   Other Visit Diagnoses     Screening for endocrine, metabolic and immunity disorder       Relevant Orders   CBC with Differential/Platelet   Hemoglobin A1c   Comprehensive metabolic panel   TSH   Lipid screening       Relevant Orders   Lipid panel   GAD (generalized anxiety disorder)       Relevant Medications   ALPRAZolam (XANAX) 0.5 MG tablet       Meds ordered this encounter  Medications   lamoTRIgine (LAMICTAL) 25 MG tablet    Sig: Take 1 tablet (25 mg total) by mouth daily.    Dispense:  90 tablet    Refill:  1    Order Specific Question:   Supervising Provider    Answer:   Carlota Raspberry, JEFFREY R [[id number]]   lamoTRIgine (LAMICTAL) 100 MG tablet    Sig: TAKE 1 TABLET(100 MG) BY MOUTH DAILY    Dispense:  90 tablet    Refill:  1    Order Specific Question:   Supervising Provider    Answer:   Carlota Raspberry, JEFFREY R [[id number]]   amphetamine-dextroamphetamine (ADDERALL) 20 MG tablet    Sig: Take 1 tablet (20 mg total) by mouth 2 (two) times daily.    Dispense:  60 tablet    Refill:  0    Order Specific Question:   Supervising Provider    Answer:   Carlota Raspberry, JEFFREY R [[id number]]   ALPRAZolam (XANAX) 0.5 MG tablet    Sig: Take 1 tablet (0.5  mg total) by mouth at bedtime.    Dispense:  30 tablet    Refill:  2    Order Specific Question:   Supervising Provider    Answer:   Carlota Raspberry, JEFFREY R [[id number]]   amphetamine-dextroamphetamine (ADDERALL) 20 MG tablet    Sig: Take 1 tablet (20 mg total) by mouth 2 (two) times daily.    Dispense:  60 tablet    Refill:  0    Order Specific Question:   Supervising Provider    Answer:   Carlota Raspberry, JEFFREY R [[id number]]   amphetamine-dextroamphetamine (ADDERALL) 20 MG tablet    Sig: Take 1 tablet (20 mg total) by mouth 2 (two) times daily.    Dispense:  60 tablet    Refill:  0    Order Specific Question:   Supervising Provider    Answer:   Carlota Raspberry, JEFFREY R [[id number]]    Follow-up: No follow-ups on file.   PLAN Meds refilled as above Labs collected. Will follow up with the patient as warranted. Refer to derm. Concern given her hx of melanoma Patient encouraged to call clinic with any questions, comments, or concerns.  Maximiano Coss, NP

## 2020-09-13 LAB — LIPID PANEL
Cholesterol: 160 mg/dL (ref 0–200)
HDL: 50.2 mg/dL (ref >39.00)
LDL Cholesterol: 97 mg/dL (ref 0–99)
NonHDL: 109.45
Total CHOL/HDL Ratio: 3
Triglycerides: 63 mg/dL (ref 0.0–149.0)
VLDL: 12.6 mg/dL (ref 0.0–40.0)

## 2020-09-13 LAB — COMPREHENSIVE METABOLIC PANEL
ALT: 14 U/L (ref 0–35)
AST: 13 U/L (ref 0–37)
Albumin: 4.4 g/dL (ref 3.5–5.2)
Alkaline Phosphatase: 47 U/L (ref 39–117)
BUN: 7 mg/dL (ref 6–23)
CO2: 29 mEq/L (ref 19–32)
Calcium: 9.2 mg/dL (ref 8.4–10.5)
Chloride: 104 mEq/L (ref 96–112)
Creatinine, Ser: 0.74 mg/dL (ref 0.40–1.20)
GFR: 98.7 mL/min (ref >60.00)
Glucose, Bld: 83 mg/dL (ref 70–99)
Potassium: 4.1 mEq/L (ref 3.5–5.1)
Sodium: 140 mEq/L (ref 135–145)
Total Bilirubin: 1.2 mg/dL (ref 0.2–1.2)
Total Protein: 6.5 g/dL (ref 6.0–8.3)

## 2020-09-13 LAB — CBC WITH DIFFERENTIAL/PLATELET
Basophils Absolute: 0 10*3/uL (ref 0.0–0.1)
Basophils Relative: 0.5 % (ref 0.0–3.0)
Eosinophils Absolute: 0.1 10*3/uL (ref 0.0–0.7)
Eosinophils Relative: 1.1 % (ref 0.0–5.0)
HCT: 39.4 % (ref 36.0–46.0)
Hemoglobin: 13.1 g/dL (ref 12.0–15.0)
Lymphocytes Relative: 31 % (ref 12.0–46.0)
Lymphs Abs: 2.6 10*3/uL (ref 0.7–4.0)
MCHC: 33.3 g/dL (ref 30.0–36.0)
MCV: 92.1 fl (ref 78.0–100.0)
Monocytes Absolute: 0.4 10*3/uL (ref 0.1–1.0)
Monocytes Relative: 4.6 % (ref 3.0–12.0)
Neutro Abs: 5.2 10*3/uL (ref 1.4–7.7)
Neutrophils Relative %: 62.8 % (ref 43.0–77.0)
Platelets: 239 10*3/uL (ref 150.0–400.0)
RBC: 4.28 Mil/uL (ref 3.87–5.11)
RDW: 13.8 % (ref 11.5–15.5)
WBC: 8.3 10*3/uL (ref 4.0–10.5)

## 2020-09-13 LAB — TSH: TSH: 1.42 u[IU]/mL (ref 0.35–5.50)

## 2020-09-13 LAB — HEMOGLOBIN A1C: Hgb A1c MFr Bld: 5.4 % (ref 4.6–6.5)

## 2020-09-13 LAB — VITAMIN D 25 HYDROXY (VIT D DEFICIENCY, FRACTURES): VITD: 41.7 ng/mL (ref 30.00–100.00)

## 2020-10-25 ENCOUNTER — Other Ambulatory Visit: Payer: Self-pay | Admitting: Obstetrics and Gynecology

## 2020-10-25 DIAGNOSIS — R928 Other abnormal and inconclusive findings on diagnostic imaging of breast: Secondary | ICD-10-CM

## 2020-11-03 ENCOUNTER — Ambulatory Visit: Payer: 59

## 2020-11-03 ENCOUNTER — Other Ambulatory Visit: Payer: Self-pay

## 2020-11-03 ENCOUNTER — Ambulatory Visit
Admission: RE | Admit: 2020-11-03 | Discharge: 2020-11-03 | Disposition: A | Discharge status: Home / Self Care | Payer: 59 | Source: Ambulatory Visit | Attending: Obstetrics and Gynecology | Admitting: Obstetrics and Gynecology

## 2020-11-03 DIAGNOSIS — R928 Other abnormal and inconclusive findings on diagnostic imaging of breast: Secondary | ICD-10-CM

## 2020-11-03 LAB — HM MAMMOGRAPHY

## 2020-11-17 ENCOUNTER — Telehealth: Payer: Self-pay

## 2020-11-17 NOTE — Telephone Encounter (Signed)
Called and notified patient that her Adderall is at the pharmacy and has been ready since 11/07/20. Patient understood and will let us know if she has trouble picking it up.

## 2020-11-17 NOTE — Telephone Encounter (Signed)
Caller name:Tyreona Emmalee Solivan callback [phone]  Encourage patient to contact the pharmacy for refills or they can request refills through [facility]  (Please schedule appointment if patient has not been seen in over a year)  MEDICATION NAME & DOSE:amphetamine-dextroamphetamine (ADDERALL) 20 MG tablet [[id number]]   Notes/Comments from [place], [place] - [address]   [place]:   Please notify patient: It takes 48-72 hours to process rx refill requests Ask patient to call pharmacy to ensure rx is ready before heading there.   (CLINICAL TO FILL OR ROUTE PER PROTOCOLS)

## 2021-01-06 ENCOUNTER — Other Ambulatory Visit: Payer: Self-pay | Admitting: Registered Nurse

## 2021-01-06 DIAGNOSIS — F900 Attention-deficit hyperactivity disorder, predominantly inattentive type: Secondary | ICD-10-CM

## 2021-01-06 NOTE — Telephone Encounter (Signed)
..  Caller name:  Yarelly Kuba  Caller callback [phone]  Encourage patient to contact the pharmacy for refills or they can request refills through [facility]  (Please schedule appointment if patient has not been seen in over a year)  [place] Notes/Comments from patient:  [place] TO: [facility] [place]!  Please notify patient: It takes 48-72 hours to process rx refill requests Ask patient to call pharmacy to ensure rx is ready before heading there.   (CLINICAL TO FILL OR ROUTE PER PROTOCOLS)

## 2021-01-06 NOTE — Telephone Encounter (Signed)
Patient is requesting a refill of the following medications: Requested Prescriptions   Pending Prescriptions Disp Refills   amphetamine-dextroamphetamine (ADDERALL) 20 MG tablet 60 tablet 0    Sig: Take 1 tablet (20 mg total) by mouth 2 (two) times daily.   amphetamine-dextroamphetamine (ADDERALL) 20 MG tablet 60 tablet 0    Sig: Take 1 tablet (20 mg total) by mouth 2 (two) times daily.   amphetamine-dextroamphetamine (ADDERALL) 20 MG tablet 60 tablet 0    Sig: Take 1 tablet (20 mg total) by mouth 2 (two) times daily.    Date of patient request: 01/06/21 Last office visit: 09/12/20 Date of last refill: 11/07/20 Last refill amount: 60x2R  Follow up time period per chart: 6 months

## 2021-01-09 MED ORDER — AMPHETAMINE-DEXTROAMPHETAMINE 20 MG PO TABS: 20.0000 mg | ORAL_TABLET | Freq: Two times a day (BID) | ORAL | 0 refills | Status: DC

## 2021-03-01 ENCOUNTER — Telehealth: Payer: Self-pay

## 2021-03-01 NOTE — Telephone Encounter (Signed)
Pt is not due for new script as there is still one refill set to be filled 03/06/21.

## 2021-03-01 NOTE — Telephone Encounter (Signed)
Called to discuss with pt but no answer, left a message

## 2021-03-01 NOTE — Telephone Encounter (Signed)
Caller name:Janazia Towanda Octave callback [phone]  Encourage patient to contact the pharmacy for refills or they can request refills through [facility]  (Please schedule appointment if patient has not been seen in over a year)  MEDICATION NAME & DOSE:amphetamine-dextroamphetamine (ADDERALL) 20 MG tablet   Notes/Comments from patient:  [place] TO: [place], [place] - [address]   Please notify patient: It takes 48-72 hours to process rx refill requests Ask patient to call pharmacy to ensure rx is ready before heading there.   (CLINICAL TO FILL OR ROUTE PER PROTOCOLS)

## 2021-03-03 ENCOUNTER — Telehealth: Payer: Self-pay

## 2021-03-03 NOTE — Telephone Encounter (Signed)
Caller name:Rever Sabra Heck    On DPR? :Yes  Call back number:[phone]  Provider they see: Delfino Lovett  Reason for call:[place] wide shortage of  amphetamine-dextroamphetamine (ADDERALL) 20 MG tablet  Pt is wanting to know if she can be switched to something else close to that since there is a shortage on this type of medication she can not find no where that has this medication?

## 2021-03-06 ENCOUNTER — Other Ambulatory Visit: Payer: Self-pay | Admitting: Registered Nurse

## 2021-03-06 DIAGNOSIS — F900 Attention-deficit hyperactivity disorder, predominantly inattentive type: Secondary | ICD-10-CM

## 2021-03-06 MED ORDER — DEXMETHYLPHENIDATE HCL 10 MG PO TABS: 10.0000 mg | ORAL_TABLET | Freq: Two times a day (BID) | ORAL | 0 refills | Status: DC

## 2021-03-06 NOTE — Progress Notes (Signed)
Adderall shortage, pt requesting alternative Sending Focalin IR 10mg  po bid  [name], NP

## 2021-03-06 NOTE — Telephone Encounter (Signed)
Patient would like to know what she could be switched to due to the shortage of Adderall nationwide.

## 2021-03-06 NOTE — Telephone Encounter (Signed)
Sent Focalin 10mg  po bid  Thanks,  [employer]

## 2021-03-07 NOTE — Telephone Encounter (Signed)
Called and made patient aware. 

## 2021-04-18 ENCOUNTER — Telehealth: Payer: Self-pay

## 2021-04-18 NOTE — Telephone Encounter (Signed)
Encourage patient to contact the pharmacy for refills or they can request refills through [facility] ? ?(Please schedule appointment if patient has not been seen in over a year) ? ? ? ?WHAT PHARMACY WOULD THEY LIKE THIS SENT TO: [facility] [place] - [place], [place] - [address]  ? ?MEDICATION NAME & DOSE:amphetamine-dextroamphetamine (ADDERALL) 20 MG tablet  ? ?NOTES/COMMENTS FROM PATIENT: ? ? ? ? ? ?[place] office please notify patient: ?It takes 48-72 hours to process rx refill requests ?Ask patient to call pharmacy to ensure rx is ready before heading there.  ? ?

## 2021-04-19 ENCOUNTER — Other Ambulatory Visit: Payer: Self-pay | Admitting: Registered Nurse

## 2021-04-19 DIAGNOSIS — F900 Attention-deficit hyperactivity disorder, predominantly inattentive type: Secondary | ICD-10-CM

## 2021-04-19 MED ORDER — AMPHETAMINE-DEXTROAMPHETAMINE 20 MG PO TABS: 20.0000 mg | ORAL_TABLET | Freq: Two times a day (BID) | ORAL | 0 refills | Status: DC

## 2021-04-19 NOTE — Telephone Encounter (Signed)
Refill sent  Thanks,  Rich

## 2021-04-19 NOTE — Telephone Encounter (Signed)
Noted  

## 2021-04-19 NOTE — Telephone Encounter (Signed)
Error

## 2021-05-02 ENCOUNTER — Other Ambulatory Visit: Payer: Self-pay

## 2021-05-02 DIAGNOSIS — F4310 Post-traumatic stress disorder, unspecified: Secondary | ICD-10-CM

## 2021-05-02 DIAGNOSIS — F319 Bipolar disorder, unspecified: Secondary | ICD-10-CM

## 2021-05-02 NOTE — Telephone Encounter (Signed)
MEDICATION: lamoTRIgine (LAMICTAL) 100 MG tablet // lamoTRIgine (LAMICTAL) 25 MG tablet ? ?PHARMACY: [facility] [phone] - [place], [place] - [address] ? ?Comments: Patient is calling in stating pharmacy faxed a request over on Friday. Patient is completely out.  ? ?**Let patient know to contact pharmacy at the end of the day to make sure medication is ready. ** ? ?** Please notify patient to allow 48-72 hours to process** ? ?**Encourage patient to contact the pharmacy for refills or they can request refills through [facility]** ? ? ?

## 2021-05-03 MED ORDER — LAMOTRIGINE 25 MG PO TABS: 25.0000 mg | ORAL_TABLET | Freq: Every day | ORAL | 1 refills | Status: DC

## 2021-05-03 MED ORDER — LAMOTRIGINE 100 MG PO TABS: ORAL_TABLET | ORAL | 1 refills | Status: DC

## 2021-05-03 NOTE — Telephone Encounter (Signed)
Patient is requesting a refill of the following medications: ?Requested Prescriptions  ? ?Pending Prescriptions Disp Refills  ? lamoTRIgine (LAMICTAL) 100 MG tablet 90 tablet 1  ?  Sig: TAKE 1 TABLET(100 MG) BY MOUTH DAILY  ? lamoTRIgine (LAMICTAL) 25 MG tablet 90 tablet 1  ?  Sig: Take 1 tablet (25 mg total) by mouth daily.  ? ? ?Date of patient request: 05/02/2021 ?Last office visit: 07/15/2020 ?Date of last refill: 09/12/2020 ?Last refill amount: 90 tablets 1 refill for both ?Follow up time period per chart: none  ?

## 2021-05-26 ENCOUNTER — Other Ambulatory Visit: Payer: Self-pay

## 2021-05-26 DIAGNOSIS — F900 Attention-deficit hyperactivity disorder, predominantly inattentive type: Secondary | ICD-10-CM

## 2021-05-26 NOTE — Telephone Encounter (Signed)
MEDICATION: amphetamine-dextroamphetamine (ADDERALL) 20 MG tablet ? ?PHARMACY: [facility] [phone] - [place], [place] - [address] ? ?Comments: Patient is completely out.  ? ?**Let patient know to contact pharmacy at the end of the day to make sure medication is ready. ** ? ?** Please notify patient to allow 48-72 hours to process** ? ?**Encourage patient to contact the pharmacy for refills or they can request refills through [facility]** ? ? ?

## 2021-05-29 ENCOUNTER — Telehealth (INDEPENDENT_AMBULATORY_CARE_PROVIDER_SITE_OTHER): Payer: 59 | Admitting: Registered Nurse

## 2021-05-29 ENCOUNTER — Other Ambulatory Visit: Payer: Self-pay

## 2021-05-29 ENCOUNTER — Encounter: Payer: Self-pay | Admitting: Registered Nurse

## 2021-05-29 DIAGNOSIS — L255 Unspecified contact dermatitis due to plants, except food: Secondary | ICD-10-CM | POA: Diagnosis not present

## 2021-05-29 DIAGNOSIS — F900 Attention-deficit hyperactivity disorder, predominantly inattentive type: Secondary | ICD-10-CM

## 2021-05-29 MED ORDER — AMPHETAMINE-DEXTROAMPHETAMINE 20 MG PO TABS: 20.0000 mg | ORAL_TABLET | Freq: Two times a day (BID) | ORAL | 0 refills | Status: DC

## 2021-05-29 MED ORDER — TRIAMCINOLONE ACETONIDE 0.1 % EX CREA: 1.0000 "application " | TOPICAL_CREAM | Freq: Two times a day (BID) | CUTANEOUS | 0 refills | Status: DC

## 2021-05-29 NOTE — Patient Instructions (Signed)
° ° ° °  If you have lab work done today you will be contacted with your lab results within the next 2 weeks.  If you have not heard from us then please contact us. The fastest way to get your results is to register for My Chart. ° ° °IF you received an x-ray today, you will receive an invoice from [facility]. Please contact [facility] at [phone] with questions or concerns regarding your invoice.  ° °IF you received labwork today, you will receive an invoice from [facility]. Please contact [facility] at [phone] with questions or concerns regarding your invoice.  ° °Our billing staff will not be able to assist you with questions regarding bills from these companies. ° °You will be contacted with the lab results as soon as they are available. The fastest way to get your results is to activate your My Chart account. Instructions are located on the last page of this paperwork. If you have not heard from us regarding the results in 2 weeks, please contact this office. °  ° ° ° °

## 2021-05-29 NOTE — Progress Notes (Signed)
? ? ?Telemedicine Encounter- SOAP NOTE Established Patient ? ?This Video encounter was conducted with the patient's (or proxy's) verbal consent via audio telecommunications: yes/no: Yes ?Patient was instructed to have this encounter in a suitably private space; and to only have persons present to whom they give permission to participate. In addition, patient identity was confirmed by use of name plus two identifiers (DOB and address).  I discussed the limitations, risks, security and privacy concerns of performing an evaluation and management service by telephone and the availability of in person appointments. I also discussed with the patient that there may be a patient responsible charge related to this service. The patient expressed understanding and agreed to proceed. ? ?I spent a total of 14 mintues talking with the patient or their proxy. ? ?Patient at home ?Provider in office ? ?Participants: Wendy Ruddy, NP and Wendy Hawkins ? ?Chief Complaint  ?Patient presents with  ? Follow-up  ?  Patient states she is following up on medication and refills  ? ? ?Subjective  ? ?Wendy Hawkins is a 46 y.o. established patient. Video visit today for med refills, rash ? ?HPI ?Med refills ?Adderall '20mg'$  po bid ?Good effect, no AE ?Hopes to continue ?Takes M-f mostly ?Pdmp consulted, no concerns ? ?Rash ?Onset after yard work ?No hx of plant based contact dermatitis ?Vesicular rash on hands, arms, neck, face ?Has partially resolved into dry, flaking skin ?Notes it to be exquisitely itchy but no pain ?No sick contacts ? ?Patient Active Problem List  ? Diagnosis Date Noted  ? Obesity 07/02/2017  ? Visit for preventive health examination 04/23/2017  ? Vitamin D deficiency 04/14/2015  ? ADHD (attention deficit hyperactivity disorder), inattentive type 08/27/2013  ? Bipolar I disorder ([place]) 08/27/2013  ? 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 PTSD (post-traumatic stress disorder) 09/25/2012  ? ? ?Past Medical History:  ?Diagnosis  Date  ? Anxiety   ? Cancer [facility])   ? Melanoma skin cancer, ankle, calf, and back of leg  ? Depression   ? Dr. Rachel Moulds  ? GERD (gastroesophageal reflux disease)   ? ? ?Current Outpatient Medications  ?Medication Sig Dispense Refill  ? ALPRAZolam (XANAX) 0.5 MG tablet Take 1 tablet (0.5 mg total) by mouth at bedtime. 30 tablet 2  ? amphetamine-dextroamphetamine (ADDERALL) 20 MG tablet Take 1 tablet (20 mg total) by mouth 2 (two) times daily. 60 tablet 0  ? [START ON 06/26/2021] amphetamine-dextroamphetamine (ADDERALL) 20 MG tablet Take 1 tablet (20 mg total) by mouth 2 (two) times daily. 60 tablet 0  ? [START ON 07/24/2021] amphetamine-dextroamphetamine (ADDERALL) 20 MG tablet Take 1 tablet (20 mg total) by mouth 2 (two) times daily. 60 tablet 0  ? dexmethylphenidate (FOCALIN) 10 MG tablet Take 1 tablet (10 mg total) by mouth 2 (two) times daily. 60 tablet 0  ? lamoTRIgine (LAMICTAL) 100 MG tablet TAKE 1 TABLET(100 MG) BY MOUTH DAILY 90 tablet 1  ? lamoTRIgine (LAMICTAL) 25 MG tablet Take 1 tablet (25 mg total) by mouth daily. 90 tablet 1  ? levonorgestrel (MIRENA) 20 MCG/24HR IUD 1 Intra Uterine Device (1 each total) by Intrauterine route once. 1 each 0  ? triamcinolone cream (KENALOG) 0.1 % Apply 1 application. topically 2 (two) times daily. 30 g 0  ? albuterol (VENTOLIN HFA) 108 (90 Base) MCG/ACT inhaler Inhale 2 puffs into the lungs every 6 (six) hours as needed for wheezing or shortness of breath. (Patient not taking: Reported on 09/12/2020) 8 g 0  ? ?No current  facility-administered medications for this visit.  ? ? ?Allergies  ?Allergen Reactions  ? Codeine Nausea And Vomiting  ? Depo-Medrol [Methylprednisolone Sodium Succ] Hives  ? ? ?Social History  ? ?Socioeconomic History  ? Marital status: Married  ?  Spouse name: Not on file  ? Number of children: 3  ? Years of education: Not on file  ? Highest education level: Not on file  ?Occupational History  ? Occupation: SCHEDULER  ?  Employer: [employer]   ?  Comment: [employer]  ?Tobacco Use  ? Smoking status: Former  ?  Packs/day: 1.00  ?  Years: 20.00  ?  Pack years: 20.00  ?  Types: Cigarettes  ?  Quit date: 06/04/2012  ?  Years since quitting: 8.9  ? Smokeless tobacco: Never  ? Tobacco comments:  ?  e cigs periodically  ?Vaping Use  ? Vaping Use: Every day  ?Substance and Sexual Activity  ? Alcohol use: Yes  ?  Alcohol/week: 2.0 standard drinks  ?  Types: 2 Shots of liquor per week  ?  Comment: socially  ? Drug use: Yes  ?  Types: Marijuana  ? Sexual activity: Yes  ?  Birth control/protection: I.U.D.  ?Other Topics Concern  ? Not on file  ?Social History Narrative  ? Not on file  ? ?Social Determinants of Health  ? ?Financial Resource Strain: Not on file  ?Food Insecurity: Not on file  ?Transportation Needs: Not on file  ?Physical Activity: Not on file  ?Stress: Not on file  ?Social Connections: Not on file  ?Intimate Partner Violence: Not on file  ? ? ?ROS ?Per hpi  ? ?Objective  ? ?Vitals as reported by the patient: ?There were no vitals filed for this visit. ? ?Wendy Hawkins was seen today for follow-up. ? ?Diagnoses and all orders for this visit: ? ?Contact dermatitis due to plants, except food, unspecified contact dermatitis type ?-     triamcinolone cream (KENALOG) 0.1 %; Apply 1 application. topically 2 (two) times daily. ? ?ADHD (attention deficit hyperactivity disorder), inattentive type ? ? ? ?PLAN ?Refill adderall x 6 mo, return for CPE and labs at that time. ?Triamcinolone for what I suspect to be plant contact dermatitis. Do not use on face or groin. Return if worsening or failing to improve ?Patient encouraged to call clinic with any questions, comments, or concerns. ? ? ?I discussed the assessment and treatment plan with the patient. The patient was provided an opportunity to ask questions and all were answered. The patient agreed with the plan and demonstrated an understanding of the instructions. ?  ?The patient was advised to call back or seek an  in-person evaluation if the symptoms worsen or if the condition fails to improve as anticipated. ? ?I provided 16 minutes of face-to-face time during this encounter. ? ?Maximiano Coss, NP ? ?

## 2021-05-29 NOTE — Telephone Encounter (Signed)
Patient is requesting a refill of the following medications: ?Requested Prescriptions  ? ?Pending Prescriptions Disp Refills  ? amphetamine-dextroamphetamine (ADDERALL) 20 MG tablet 60 tablet 0  ?  Sig: Take 1 tablet (20 mg total) by mouth 2 (two) times daily.  ? amphetamine-dextroamphetamine (ADDERALL) 20 MG tablet 60 tablet 0  ?  Sig: Take 1 tablet (20 mg total) by mouth 2 (two) times daily.  ? amphetamine-dextroamphetamine (ADDERALL) 20 MG tablet 60 tablet 0  ?  Sig: Take 1 tablet (20 mg total) by mouth 2 (two) times daily.  ? ? ?Date of patient request: 05/29/21 ?Last office visit: 09/12/20 ?Date of last refill: 04/19/21 ?Last refill amount: 60 ?Follow up time period per chart: 6 months ? ?

## 2021-07-13 ENCOUNTER — Telehealth: Payer: Self-pay | Admitting: Registered Nurse

## 2021-07-13 NOTE — Telephone Encounter (Signed)
PT calling stating that she need a refill on her amphetamine-dextroamphetamine 20 mg. PT also stats that her pharmacy only gave her Lamictal 100 mg but not Lamictal 25 mg. Pharmacy told pt to contract [place] for Lamictal 25 mg.

## 2021-07-13 NOTE — Telephone Encounter (Signed)
Both Lamictal prescriptions were ordered on the same day w/ the same # of refills.  She just needs to tell the pharmacy that she needs to prescription for the '25mg'$  pills.  As for her Adderall, 3 prescriptions were sent in w/ different fill dates.  It is not time for new prescriptions

## 2021-07-14 NOTE — Telephone Encounter (Signed)
Left pt a detailed VM explaining that both Lamictal Rx was sent in on same day she needs to tell pharmacy she need the 25 mg tablets . I explained it is to soon to refill Adderall .

## 2021-07-24 ENCOUNTER — Telehealth: Payer: Self-pay | Admitting: Registered Nurse

## 2021-07-25 ENCOUNTER — Other Ambulatory Visit: Payer: Self-pay | Admitting: Registered Nurse

## 2021-07-25 DIAGNOSIS — L255 Unspecified contact dermatitis due to plants, except food: Secondary | ICD-10-CM

## 2021-07-25 DIAGNOSIS — F900 Attention-deficit hyperactivity disorder, predominantly inattentive type: Secondary | ICD-10-CM

## 2021-07-25 MED ORDER — AMPHETAMINE-DEXTROAMPHETAMINE 30 MG PO TABS: 30.0000 mg | ORAL_TABLET | Freq: Every day | ORAL | 0 refills | Status: DC

## 2021-07-26 ENCOUNTER — Other Ambulatory Visit: Payer: Self-pay | Admitting: Registered Nurse

## 2021-07-26 DIAGNOSIS — F900 Attention-deficit hyperactivity disorder, predominantly inattentive type: Secondary | ICD-10-CM

## 2021-07-26 MED ORDER — AMPHETAMINE-DEXTROAMPHETAMINE 10 MG PO TABS: 10.0000 mg | ORAL_TABLET | Freq: Every day | ORAL | 0 refills | Status: DC

## 2021-07-26 NOTE — Telephone Encounter (Signed)
Sent ? ?Thanks, ? ?Rich

## 2021-08-22 ENCOUNTER — Other Ambulatory Visit: Payer: Self-pay

## 2021-08-22 DIAGNOSIS — F900 Attention-deficit hyperactivity disorder, predominantly inattentive type: Secondary | ICD-10-CM

## 2021-08-22 MED ORDER — AMPHETAMINE-DEXTROAMPHETAMINE 10 MG PO TABS: 10.0000 mg | ORAL_TABLET | Freq: Every day | ORAL | 0 refills | Status: DC

## 2021-08-22 NOTE — Telephone Encounter (Signed)
Patient is requesting a refill of the following medications: Requested Prescriptions   Pending Prescriptions Disp Refills   amphetamine-dextroamphetamine (ADDERALL) 10 MG tablet 30 tablet 0    Sig: Take 1 tablet (10 mg total) by mouth daily at 12 noon.    Date of patient request: 7/25/ Last office visit: 05/29/21 Date of last refill: 07/26/21 Last refill amount: 30

## 2021-08-22 NOTE — Telephone Encounter (Signed)
Last visit 05/29/21 Last refill 07/26/21  #30 no refills  Pdmp consulted manually. Unable to access via EMR.  [name], NP

## 2021-08-29 ENCOUNTER — Telehealth: Payer: Self-pay | Admitting: Registered Nurse

## 2021-08-29 ENCOUNTER — Other Ambulatory Visit: Payer: Self-pay

## 2021-08-29 DIAGNOSIS — F319 Bipolar disorder, unspecified: Secondary | ICD-10-CM

## 2021-08-29 DIAGNOSIS — F4310 Post-traumatic stress disorder, unspecified: Secondary | ICD-10-CM

## 2021-08-29 MED ORDER — LAMOTRIGINE 25 MG PO TABS: 25.0000 mg | ORAL_TABLET | Freq: Every day | ORAL | 1 refills | Status: DC

## 2021-08-29 MED ORDER — LAMOTRIGINE 100 MG PO TABS: ORAL_TABLET | ORAL | 1 refills | Status: DC

## 2021-08-29 NOTE — Telephone Encounter (Signed)
Encourage patient to contact the pharmacy for refills or they can request refills through [facility]  (Please schedule appointment if patient has not been seen in over a year)    [place] THIS SENT TO: [facility] [phone]  MEDICATION NAME & DOSE: adderall 30 mg AND lamotrigine '100mg'$   AND  lamotrigine 25 mg  NOTES/COMMENTS FROM PATIENT: pt was able to pick up adderral 10 mg but still needs 30 mg called in.       [place] office please notify patient: It takes 48-72 hours to process rx refill requests Ask patient to call pharmacy to ensure rx is ready before heading there.

## 2021-08-30 ENCOUNTER — Other Ambulatory Visit: Payer: Self-pay | Admitting: Registered Nurse

## 2021-08-30 DIAGNOSIS — F900 Attention-deficit hyperactivity disorder, predominantly inattentive type: Secondary | ICD-10-CM

## 2021-08-30 MED ORDER — AMPHETAMINE-DEXTROAMPHETAMINE 30 MG PO TABS: 30.0000 mg | ORAL_TABLET | Freq: Every day | ORAL | 0 refills | Status: DC

## 2021-08-30 NOTE — Telephone Encounter (Signed)
Done!  Thanks,  Denice Paradise

## 2021-10-04 ENCOUNTER — Other Ambulatory Visit: Payer: Self-pay | Admitting: Registered Nurse

## 2021-10-04 DIAGNOSIS — F900 Attention-deficit hyperactivity disorder, predominantly inattentive type: Secondary | ICD-10-CM

## 2021-10-04 NOTE — Telephone Encounter (Signed)
Encourage patient to contact the pharmacy for refills or they can request refills through [facility]  (Please schedule appointment if patient has not been seen in over a year)    WHAT PHARMACY WOULD THEY LIKE THIS SENT TO: [facility] [address] [phone]  MEDICATION NAME & DOSE: adderall 10 mg AND adderall '30mg'$   NOTES/COMMENTS FROM PATIENT: last refill 08/29/21; pt is aware that she needs to establish with a new pcp      Front office please notify patient: It takes 48-72 hours to process rx refill requests Ask patient to call pharmacy to ensure rx is ready before heading there.

## 2021-10-10 NOTE — Addendum Note (Signed)
Addended by: Patrcia Dolly on: 10/10/2021 04:42 PM   Modules accepted: Orders

## 2021-10-11 MED ORDER — AMPHETAMINE-DEXTROAMPHETAMINE 30 MG PO TABS: 30.0000 mg | ORAL_TABLET | Freq: Every day | ORAL | 0 refills | Status: DC

## 2021-10-11 MED ORDER — AMPHETAMINE-DEXTROAMPHETAMINE 10 MG PO TABS: 10.0000 mg | ORAL_TABLET | Freq: Every day | ORAL | 0 refills | Status: DC

## 2021-10-11 NOTE — Addendum Note (Signed)
Addended by: Merri Ray R on: 10/11/2021 10:11 PM   Modules accepted: Orders

## 2021-10-11 NOTE — Telephone Encounter (Signed)
Controlled substance database reviewed.  #30 dextroamphetamine 30 mg tabs on 08/30/2021, #30 of dextroamphetamine 10 mg tabs on 08/23/2021. Last visit May 29, 2021.  Adderall 20 mg twice daily noted at that time. We will refill 30 mg and 10 mg doses.  Temporary refills.  Will need to follow-up with new provider as planned

## 2021-11-08 ENCOUNTER — Encounter: Payer: Self-pay | Admitting: Family Medicine

## 2021-11-08 DIAGNOSIS — Z85820 Personal history of malignant melanoma of skin: Secondary | ICD-10-CM | POA: Insufficient documentation

## 2021-11-08 DIAGNOSIS — F419 Anxiety disorder, unspecified: Secondary | ICD-10-CM | POA: Insufficient documentation

## 2021-11-09 ENCOUNTER — Encounter: Payer: Self-pay | Admitting: Family Medicine

## 2021-11-09 ENCOUNTER — Ambulatory Visit (INDEPENDENT_AMBULATORY_CARE_PROVIDER_SITE_OTHER): Payer: 59 | Admitting: Family Medicine

## 2021-11-09 VITALS — BP 110/75 | HR 76 | Temp 98.1°F | Ht 66.0 in | Wt 184.6 lb

## 2021-11-09 DIAGNOSIS — F988 Other specified behavioral and emotional disorders with onset usually occurring in childhood and adolescence: Secondary | ICD-10-CM

## 2021-11-09 DIAGNOSIS — K625 Hemorrhage of anus and rectum: Secondary | ICD-10-CM

## 2021-11-09 DIAGNOSIS — Z0000 Encounter for general adult medical examination without abnormal findings: Secondary | ICD-10-CM

## 2021-11-09 DIAGNOSIS — K649 Unspecified hemorrhoids: Secondary | ICD-10-CM | POA: Diagnosis not present

## 2021-11-09 DIAGNOSIS — F411 Generalized anxiety disorder: Secondary | ICD-10-CM

## 2021-11-09 DIAGNOSIS — F319 Bipolar disorder, unspecified: Secondary | ICD-10-CM

## 2021-11-09 LAB — LIPID PANEL
Cholesterol: 166 mg/dL (ref 0–200)
HDL: 49.9 mg/dL (ref >39.00)
LDL Cholesterol: 97 mg/dL (ref 0–99)
NonHDL: 115.73
Total CHOL/HDL Ratio: 3
Triglycerides: 94 mg/dL (ref 0.0–149.0)
VLDL: 18.8 mg/dL (ref 0.0–40.0)

## 2021-11-09 LAB — COMPREHENSIVE METABOLIC PANEL
ALT: 9 U/L (ref 0–35)
AST: 11 U/L (ref 0–37)
Albumin: 4.6 g/dL (ref 3.5–5.2)
Alkaline Phosphatase: 55 U/L (ref 39–117)
BUN: 8 mg/dL (ref 6–23)
CO2: 29 mEq/L (ref 19–32)
Calcium: 9.3 mg/dL (ref 8.4–10.5)
Chloride: 103 mEq/L (ref 96–112)
Creatinine, Ser: 0.69 mg/dL (ref 0.40–1.20)
GFR: 105.02 mL/min (ref >60.00)
Glucose, Bld: 85 mg/dL (ref 70–99)
Potassium: 4.2 mEq/L (ref 3.5–5.1)
Sodium: 140 mEq/L (ref 135–145)
Total Bilirubin: 1.3 mg/dL — ABNORMAL HIGH (ref 0.2–1.2)
Total Protein: 7 g/dL (ref 6.0–8.3)

## 2021-11-09 LAB — CBC WITH DIFFERENTIAL/PLATELET
Basophils Absolute: 0 10*3/uL (ref 0.0–0.1)
Basophils Relative: 0.7 % (ref 0.0–3.0)
Eosinophils Absolute: 0.2 10*3/uL (ref 0.0–0.7)
Eosinophils Relative: 3.3 % (ref 0.0–5.0)
HCT: 40.6 % (ref 36.0–46.0)
Hemoglobin: 13.7 g/dL (ref 12.0–15.0)
Lymphocytes Relative: 35 % (ref 12.0–46.0)
Lymphs Abs: 2.3 10*3/uL (ref 0.7–4.0)
MCHC: 33.7 g/dL (ref 30.0–36.0)
MCV: 91.3 fl (ref 78.0–100.0)
Monocytes Absolute: 0.3 10*3/uL (ref 0.1–1.0)
Monocytes Relative: 5.2 % (ref 3.0–12.0)
Neutro Abs: 3.6 10*3/uL (ref 1.4–7.7)
Neutrophils Relative %: 55.8 % (ref 43.0–77.0)
Platelets: 239 10*3/uL (ref 150.0–400.0)
RBC: 4.45 Mil/uL (ref 3.87–5.11)
RDW: 13.3 % (ref 11.5–15.5)
WBC: 6.5 10*3/uL (ref 4.0–10.5)

## 2021-11-09 LAB — TSH: TSH: 1.69 u[IU]/mL (ref 0.35–5.50)

## 2021-11-09 MED ORDER — LISDEXAMFETAMINE DIMESYLATE 50 MG PO CAPS: 50.0000 mg | ORAL_CAPSULE | Freq: Every day | ORAL | 0 refills | Status: DC

## 2021-11-09 MED ORDER — ALPRAZOLAM 0.5 MG PO TABS: 0.5000 mg | ORAL_TABLET | Freq: Every day | ORAL | 2 refills | Status: DC

## 2021-11-09 NOTE — Progress Notes (Signed)
Office Note 11/09/2021  CC:  Chief Complaint  Patient presents with   Transfer of Care   ADHD    HPI:  Wendy Hawkins is a 46 y.o. White female who is here to establish/transfer care and get annual health maintenance exam. Patient's most recent primary MD: [facility] in [place]. Old records were reviewed prior to or during today's visit.  PMP AWARE reviewed today: most recent rx for adderall xr 30 was filled 10/12/21, # 30, rx by Merri Ray, MD. Adderall '10mg'$  filled 10/16/21, #30 rx also by Dr. Carlota Raspberry. Aprazolam filled 12/2019, #30 No red flags.  Patient with history of ADD.  At one point she was managed by [place] attention center. Most recently, Adderall prescribed by PCP. Patient states she was on 20 mg Adderall twice a day at 1 point.  This dose became unavailable so she was changed over to an Adderall XR 30 mg in the morning at 10 mg plain in the afternoon. She feels like maybe this has not helped as much as the Adderall twice a day did.  Additionally, she has a diagnosis of bipolar disorder and her Lamictal was increased from 100 to 125 mg a day. Many of her times of mood instability manifest prominently as fear that something bad is going to happen to her children.  This has improved since increasing her dose of Lamictal. She has a history of significant anxiety and has used alprazolam very infrequently.  She reports a history of bright red blood per rectum, was examined by prior PCP and found to have a hemorrhoids. Patient has been reluctant to get these seen by gastroenterology but feels like she is ready to see if they have any treatment for her.  Past Medical History:  Diagnosis Date   Anxiety and depression    Dr. Rachel Moulds   Atopic dermatitis    Bipolar disease, chronic ([place])    GERD (gastroesophageal reflux disease)    Melanoma ([place])    ankle, calf, back of leg   PTSD (post-traumatic stress disorder)     Past Surgical History:   Procedure Laterality Date   TONSILLECTOMY AND ADENOIDECTOMY  2002   WISDOM TOOTH EXTRACTION      Family History  Problem Relation Age of Onset   Lung cancer Father    Hypertension Father    Heart disease Father    Skin cancer Father    Arthritis Maternal Grandmother    Hearing loss Maternal Grandmother    Arthritis Paternal Grandmother    Arthritis Paternal Grandfather    Cancer Paternal Grandfather    COPD Paternal Grandfather    Early death Paternal Grandfather    Hearing loss Paternal Grandfather    Hypercholesterolemia Paternal Grandfather    Hypertension Paternal Grandfather    Alcohol abuse Neg Hx    Diabetes Neg Hx    Drug abuse Neg Hx    Hyperlipidemia Neg Hx    Kidney disease Neg Hx    Stroke Neg Hx     Social History   Socioeconomic History   Marital status: Married    Spouse name: Not on file   Number of children: 3   Years of education: Not on file   Highest education level: Not on file  Occupational History   Occupation: Counsellor: [employer]    Comment: [employer]  Tobacco Use   Smoking status: Former    Packs/day: 1.00    Years: 20.00    Total pack years:  20.00    Types: Cigarettes    Quit date: 06/04/2012    Years since quitting: 9.4   Smokeless tobacco: Never   Tobacco comments:    e cigs periodically  Vaping Use   Vaping Use: Every day  Substance and Sexual Activity   Alcohol use: Not Currently    Alcohol/week: 2.0 standard drinks of alcohol    Types: 2 Shots of liquor per week    Comment: socially   Drug use: Yes    Types: Marijuana   Sexual activity: Yes    Partners: Male    Birth control/protection: I.U.D.    Comment: married  Other Topics Concern   Not on file  Social History Narrative   Married, 2 kids.   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   levonorgestrel (MIRENA) 20 MCG/24HR IUD 1 Intra Uterine Device (1 each total) by Intrauterine route once.   lisdexamfetamine (VYVANSE) 50 MG capsule Take 1 capsule (50 mg total) by mouth daily.   [DISCONTINUED] ALPRAZolam (XANAX) 0.5 MG tablet Take 1 tablet (0.5 mg total) by mouth at bedtime.   [DISCONTINUED] amphetamine-dextroamphetamine (ADDERALL) 10 MG tablet Take 1 tablet (10 mg total) by mouth daily at 12 noon.   [DISCONTINUED] amphetamine-dextroamphetamine (ADDERALL) 30 MG tablet Take 1 tablet by mouth daily.   ALPRAZolam (XANAX) 0.5 MG tablet Take 1 tablet (0.5 mg total) by mouth at bedtime.   [DISCONTINUED] albuterol (VENTOLIN HFA) 108 (90 Base) MCG/ACT inhaler Inhale 2 puffs into the lungs every 6 (six) hours as needed for wheezing or shortness of breath. (Patient not taking: Reported on 09/12/2020)   [DISCONTINUED] triamcinolone cream (KENALOG) 0.1 % Apply 1 application. topically 2 (two) times daily. (Patient not taking: Reported on 11/09/2021)   No facility-administered encounter medications on file as of 11/09/2021.    Allergies  Allergen Reactions   Codeine Nausea And Vomiting and Other (See Comments)   Corticosteroids Other (See Comments)   Cyclobenzaprine Other (See Comments)   Depo-Medrol [Methylprednisolone Sodium Succ] Hives    Review of Systems  Constitutional:  Negative for appetite change, chills, fatigue and fever.  HENT:  Negative for congestion, dental problem, ear pain and sore throat.   Eyes:  Negative for discharge, redness and visual disturbance.  Respiratory:  Negative for cough, chest tightness, shortness of breath  and wheezing.   Cardiovascular:  Negative for chest pain, palpitations and leg swelling.  Gastrointestinal:  Negative for abdominal pain, blood in stool, diarrhea, nausea and vomiting.  Genitourinary:  Negative for difficulty urinating, dysuria, flank pain, frequency, hematuria and urgency.  Musculoskeletal:  Negative for arthralgias, back pain, joint swelling, myalgias and neck stiffness.  Skin:  Negative for pallor and rash.  Neurological:  Negative for dizziness, speech difficulty, weakness and headaches.  Hematological:  Negative for adenopathy. Does not bruise/bleed easily.  Psychiatric/Behavioral:  Negative for confusion and sleep disturbance. The patient is not nervous/anxious.     PE; Blood pressure 110/75, pulse 76,  temperature 98.1 F (36.7 C), height '5\' 6"'$  (1.676 m), weight 184 lb 9.6 oz (83.7 kg), SpO2 99 %.Body mass index is 29.8 kg/m.   Physical Exam  Exam chaperoned by Deveron Furlong, CMA. Gen: Alert, well appearing.  Patient is oriented to person, place, time, and situation. AFFECT: pleasant, lucid thought and speech. ENT: Ears: EACs clear, normal epithelium.  TMs with good light reflex and landmarks bilaterally.  Eyes: no injection, icteris, swelling, or exudate.  EOMI, PERRLA. Nose: no drainage or turbinate edema/swelling.  No injection or focal lesion.  Mouth: lips without lesion/swelling.  Oral mucosa pink and moist.  Dentition intact and without obvious caries or gingival swelling.  Oropharynx without erythema, exudate, or swelling.  Neck: supple/nontender.  No LAD, mass, or TM.  Carotid pulses 2+ bilaterally, without bruits. CV: RRR, no m/r/g.   LUNGS: CTA bilat, nonlabored resps, good aeration in all lung fields. ABD: soft, NT, ND, BS normal.  No hepatospenomegaly or mass.  No bruits. EXT: no clubbing, cyanosis, or edema.  Musculoskeletal: no joint swelling, erythema, warmth, or tenderness.  ROM of all joints intact. Skin - no sores or suspicious lesions or  rashes or color changes  Pertinent labs:  Last CBC Lab Results  Component Value Date   WBC 8.3 09/12/2020   HGB 13.1 09/12/2020   HCT 39.4 09/12/2020   MCV 92.1 09/12/2020   MCH 30.1 02/20/2019   RDW 13.8 09/12/2020   PLT 239.0 77/41/2878   Last metabolic panel Lab Results  Component Value Date   GLUCOSE 83 09/12/2020   NA 140 09/12/2020   K 4.1 09/12/2020   CL 104 09/12/2020   CO2 29 09/12/2020   BUN 7 09/12/2020   CREATININE 0.74 09/12/2020   CALCIUM 9.2 09/12/2020   PROT 6.5 09/12/2020   ALBUMIN 4.4 09/12/2020   BILITOT 1.2 09/12/2020   ALKPHOS 47 09/12/2020   AST 13 09/12/2020   ALT 14 09/12/2020   Last lipids Lab Results  Component Value Date   CHOL 160 09/12/2020   HDL 50.20 09/12/2020   LDLCALC 97 09/12/2020   TRIG 63.0 09/12/2020   CHOLHDL 3 09/12/2020   Last hemoglobin A1c Lab Results  Component Value Date   HGBA1C 5.4 09/12/2020   Last thyroid functions Lab Results  Component Value Date   TSH 1.42 09/12/2020   T3TOTAL 139 01/27/2018   Last vitamin D Lab Results  Component Value Date   VD25OH 41.70 09/12/2020   ASSESSMENT AND PLAN:   New patient, establishing/transferring care.  1) Health maintenance exam: Reviewed age and gender appropriate health maintenance issues (prudent diet, regular exercise, health risks of tobacco and excessive alcohol, use of seatbelts, fire alarms in home, use of sunscreen).  Also reviewed age and gender appropriate health screening as well as vaccine recommendations. Vaccines: UTD Labs: fasting HP today Cervical ca screening: GYN-Dr. Gaetano Net Breast ca screening: mammogram 10/2020 normal->Dr. Gaetano Net Colon ca screening: due for initial screening-> she does not want screening colonoscopy at this time.  She also declined stool test screening today.  #2 history of bright red blood per rectum consistent with outlet bleeding. Prior PCP diagnosed her with hemorrhoids. Patient request referral to gastroenterology  today.  #3 adult ADD. We will switch over from Adderall XR 30 mg + Adderall plain 10 mg to Vyvanse 50 mg.    #4 bipolar disorder. Stable on 125 mg Lamictal daily. Of note, she describes having worsening of symptoms when she was put on Zoloft while not on a mood stabilizer.  #  5 generalized anxiety. She uses alprazolam very infrequently. Most recent aprazolam filled rx 12/2019, #30. I did prescription for #30 today.  An After Visit Summary was printed and given to the patient.  Return in about 1 month (around 12/10/2021) for ADD.  Signed:  Crissie Sickles, MD           11/09/2021

## 2021-11-10 IMAGING — CR DG WRIST COMPLETE 3+V*R*
4 series · 4 of 4 positions shown · non-contrast
Comparison: None.

CLINICAL DATA: Pain

EXAM:
RIGHT WRIST - COMPLETE 3+ VIEW

[x wrist pa right]
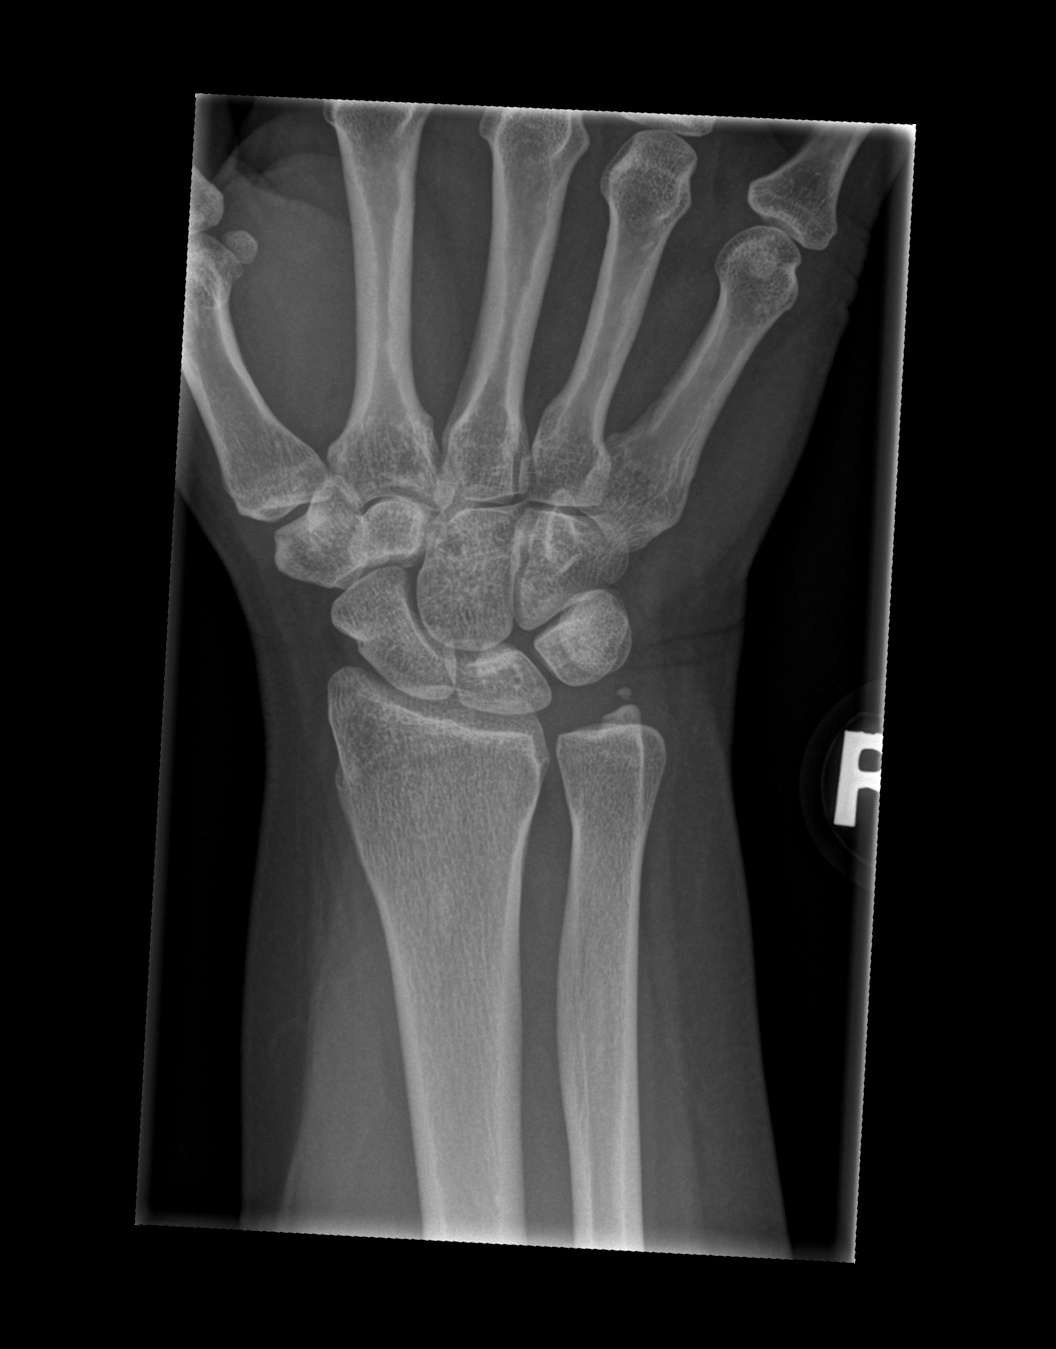

[x wrist obl right]
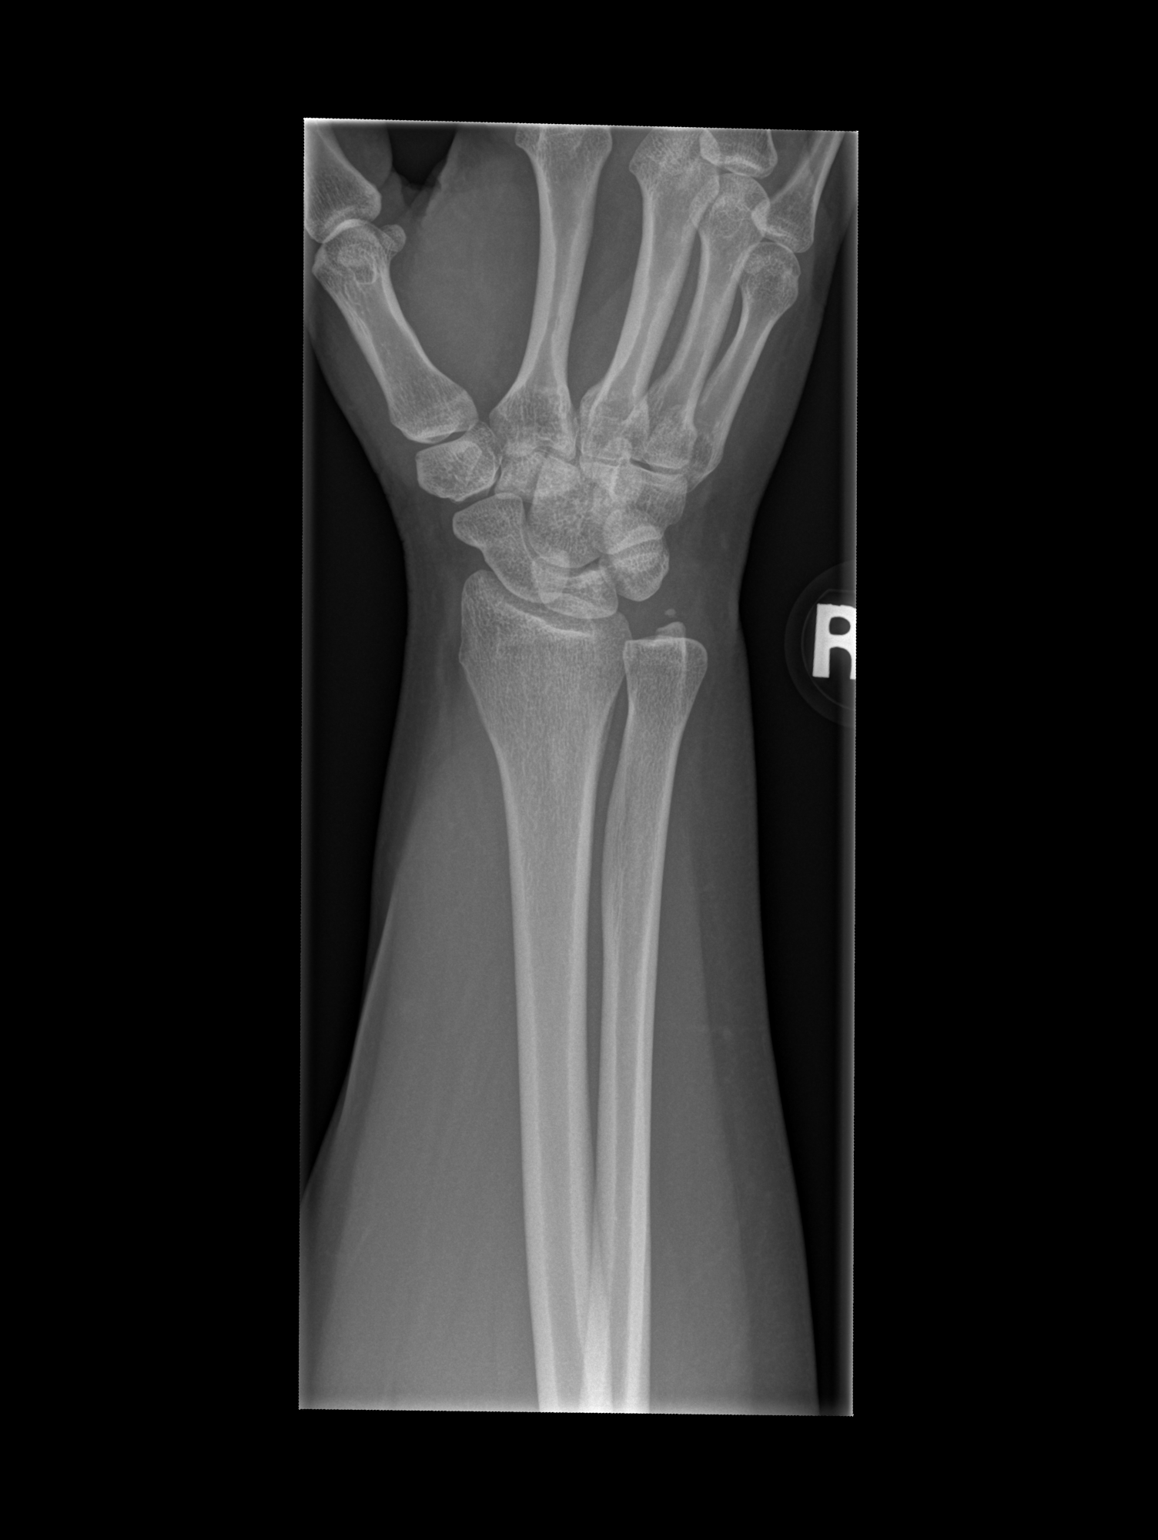

[x wrist lat right]
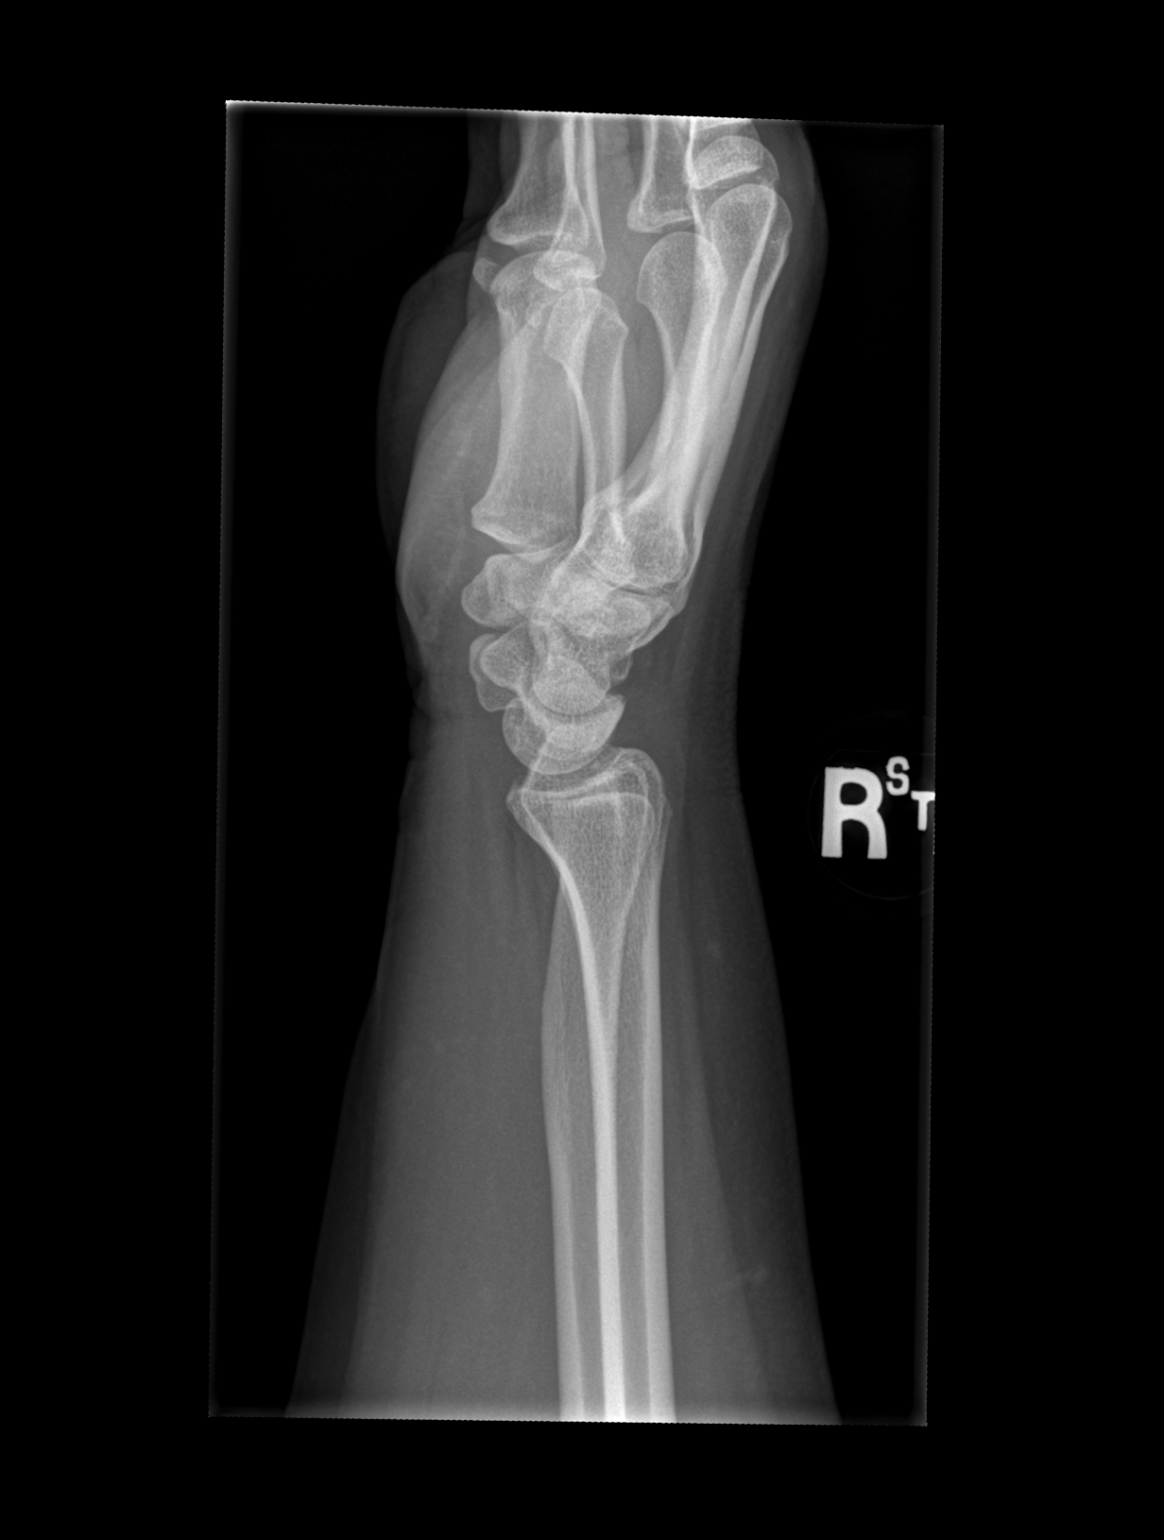

[x wrist navicular view right]
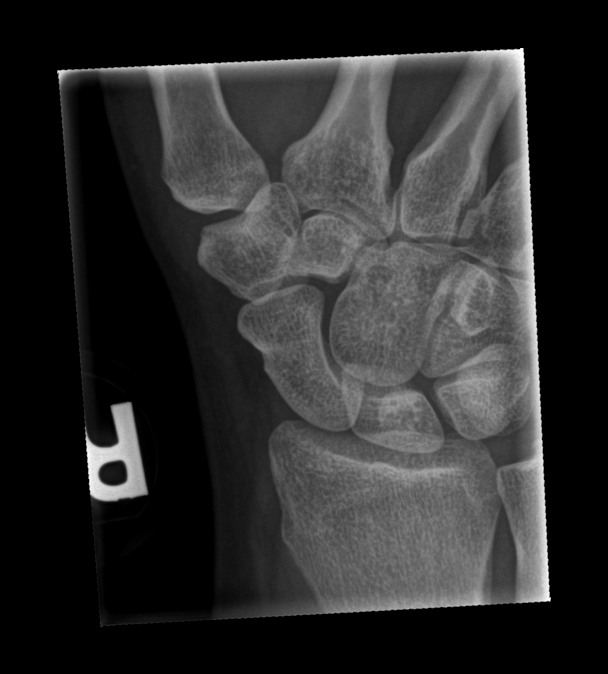

[4 of 4 positions shown; findings below may reference images not displayed]

FINDINGS: There is a tiny osseous fragment adjacent to the ulnar styloid
process that is well corticated and is felt to represent sequela of
an old remote injury. There is no acute displaced fracture or
dislocation identified on this study.
IMPRESSION: No acute displaced fracture or dislocation.

## 2021-12-06 ENCOUNTER — Other Ambulatory Visit: Payer: Self-pay | Admitting: Family Medicine

## 2021-12-06 DIAGNOSIS — F319 Bipolar disorder, unspecified: Secondary | ICD-10-CM

## 2021-12-06 DIAGNOSIS — F4310 Post-traumatic stress disorder, unspecified: Secondary | ICD-10-CM

## 2021-12-06 MED ORDER — LAMOTRIGINE 25 MG PO TABS: 25.0000 mg | ORAL_TABLET | Freq: Every day | ORAL | 1 refills | Status: DC

## 2021-12-06 MED ORDER — LAMOTRIGINE 100 MG PO TABS: ORAL_TABLET | ORAL | 1 refills | Status: DC

## 2021-12-06 MED ORDER — LISDEXAMFETAMINE DIMESYLATE 50 MG PO CAPS: 50.0000 mg | ORAL_CAPSULE | Freq: Every day | ORAL | 0 refills | Status: DC

## 2021-12-06 NOTE — Telephone Encounter (Signed)
Pt advised refills sent. °

## 2021-12-06 NOTE — Telephone Encounter (Signed)
Pt is needing a refill on lisdexamfetamine and both lamictal 100 and 25 mg. She is unsure if the request has been going to her old Dr. She is completely out of the 100 mg lamictal

## 2021-12-06 NOTE — Telephone Encounter (Signed)
Requesting: Vyvanse Contract: n/a UDS: n/a Last Visit: 11/09/21 Next Visit: 12/18/21 Last Refill: 11/09/21 (30,0)  RF request for Lamictal '100mg'$  LOV: 11/09/21 Next ov: 12/18/21 Last written: 08/29/21 (90,1) Dr.Morrow   RF request for Lamictal '25mg'$  LOV: 11/09/21 Next ov: 12/18/21 Last written: 08/29/21 (90,1) Dr.Morrow   Please advise. Meds pending

## 2021-12-18 ENCOUNTER — Encounter: Payer: Self-pay | Admitting: Family Medicine

## 2021-12-18 ENCOUNTER — Ambulatory Visit (INDEPENDENT_AMBULATORY_CARE_PROVIDER_SITE_OTHER): Payer: 59 | Admitting: Family Medicine

## 2021-12-18 VITALS — BP 116/80 | HR 80 | Temp 97.7°F | Ht 66.0 in | Wt 188.2 lb

## 2021-12-18 DIAGNOSIS — F900 Attention-deficit hyperactivity disorder, predominantly inattentive type: Secondary | ICD-10-CM

## 2021-12-18 MED ORDER — LISDEXAMFETAMINE DIMESYLATE 70 MG PO CAPS: 70.0000 mg | ORAL_CAPSULE | Freq: Every day | ORAL | 0 refills | Status: DC

## 2021-12-18 NOTE — Progress Notes (Signed)
OFFICE VISIT  12/18/2021  CC:  Chief Complaint  Patient presents with   Follow-up    Adult ADD; would like to discuss increasing current medication. Current dose is wearing of after 4-5 hours.    Patient is a 46 y.o. female who presents for 5-week follow-up adult ADD. A/P as of last visit: "1) Health maintenance exam: Reviewed age and gender appropriate health maintenance issues (prudent diet, regular exercise, health risks of tobacco and excessive alcohol, use of seatbelts, fire alarms in home, use of sunscreen).  Also reviewed age and gender appropriate health screening as well as vaccine recommendations. Vaccines: UTD Labs: fasting HP today Cervical ca screening: GYN-Dr. Gaetano Net Breast ca screening: mammogram 10/2020 normal->Dr. Gaetano Net Colon ca screening: due for initial screening-> she does not want screening colonoscopy at this time.  She also declined stool test screening today.   #2 history of bright red blood per rectum consistent with outlet bleeding. Prior PCP diagnosed her with hemorrhoids. Patient request referral to gastroenterology today.   #3 adult ADD. We will switch over from Adderall XR 30 mg + Adderall plain 10 mg to Vyvanse 50 mg.     #4 bipolar disorder. Stable on 125 mg Lamictal daily. Of note, she describes having worsening of symptoms when she was put on Zoloft while not on a mood stabilizer.   #5 generalized anxiety. She uses alprazolam very infrequently. Most recent aprazolam filled rx 12/2019, #30. I did prescription for #30 today."  INTERIM HX:  Pt states she notes improved focus, concentration, task completion.  Less frustration, better multitasking, less impulsivity and restlessness.  Mood is stable. No side effects from the medication. However, says response not as robust/effective for sx's compared to when on adderall xr AM and adderall short acting q afternoon.  Also, effect wanes signif by early afternoon.  PMP AWARE reviewed today: most  recent rx for Vyvanse 50 mg was filled 11/11/2021, #30, rx by me. No red flags.  Past Medical History:  Diagnosis Date   Anxiety and depression    Dr. Rachel Moulds   Atopic dermatitis    Bipolar disease, chronic ([place])    BRBPR (bright red blood per rectum)    hemorrhoids   GERD (gastroesophageal reflux disease)    Melanoma ([place])    ankle, calf, back of leg   PTSD (post-traumatic stress disorder)     Past Surgical History:  Procedure Laterality Date   TONSILLECTOMY AND ADENOIDECTOMY  2002   WISDOM TOOTH EXTRACTION      Outpatient Medications Prior to Visit  Medication Sig Dispense Refill   ALPRAZolam (XANAX) 0.5 MG tablet Take 1 tablet (0.5 mg total) by mouth at bedtime. 30 tablet 2   lamoTRIgine (LAMICTAL) 100 MG tablet TAKE 1 TABLET(100 MG) BY MOUTH DAILY 90 tablet 1   lamoTRIgine (LAMICTAL) 25 MG tablet Take 1 tablet (25 mg total) by mouth daily. 90 tablet 1   levonorgestrel (MIRENA) 20 MCG/24HR IUD 1 Intra Uterine Device (1 each total) by Intrauterine route once. 1 each 0   lisdexamfetamine (VYVANSE) 50 MG capsule Take 1 capsule (50 mg total) by mouth daily. 30 capsule 0   No facility-administered medications prior to visit.    Allergies  Allergen Reactions   Codeine Nausea And Vomiting and Other (See Comments)   Corticosteroids Other (See Comments)   Cyclobenzaprine Other (See Comments)   Depo-Medrol [Methylprednisolone Sodium Succ] Hives    ROS As per HPI  PE:    12/18/2021    3:36 PM 11/09/2021  10:08 AM 09/12/2020    3:32 PM  Vitals with BMI  Height '5\' 6"'$  '5\' 6"'$  '5\' 5"'$   Weight 188 lbs 3 oz 184 lbs 10 oz 174 lbs 13 oz  BMI 30.39 82.99 37.16  Systolic 967 893 810  Diastolic 80 75 68  Pulse 80 76 81     Physical Exam  Gen: alert, well appearing. No further exam today.  LABS:  none  IMPRESSION AND PLAN:  Adult ADD. Improved but not optimal efficacy or duration of effect on vyvanse 50 qd. Increase to '70mg'$  vyvanse qd.  Controlled substance  contract reviewed with patient today.  Patient signed this and it will be placed in the chart.    An After Visit Summary was printed and given to the patient.  FOLLOW UP: Return in about 4 weeks (around 01/15/2022) for f/u ADD.  Signed:  Crissie Sickles, MD           12/18/2021

## 2022-01-17 ENCOUNTER — Telehealth: Payer: Self-pay | Admitting: Family Medicine

## 2022-01-17 ENCOUNTER — Ambulatory Visit: Payer: 59 | Admitting: Family Medicine

## 2022-01-17 DIAGNOSIS — F411 Generalized anxiety disorder: Secondary | ICD-10-CM

## 2022-01-17 NOTE — Progress Notes (Deleted)
OFFICE VISIT  01/17/2022  CC: No chief complaint on file.   Patient is a 46 y.o. female who presents for 4-week follow-up adult ADD.  INTERIM HX: ***    PMP AWARE reviewed today: most recent rx for Vyvanse 70 mg was filled 12/19/2021, # 30, rx by me. Most recent prescription for alprazolam was filled 11/09/2021, #30, prescription by me. No red flags.  Past Medical History:  Diagnosis Date   Anxiety and depression    Dr. Rachel Moulds   Atopic dermatitis    Bipolar disease, chronic ([place])    BRBPR (bright red blood per rectum)    hemorrhoids   GERD (gastroesophageal reflux disease)    Melanoma ([place])    ankle, calf, back of leg   PTSD (post-traumatic stress disorder)     Past Surgical History:  Procedure Laterality Date   TONSILLECTOMY AND ADENOIDECTOMY  2002   WISDOM TOOTH EXTRACTION      Outpatient Medications Prior to Visit  Medication Sig Dispense Refill   ALPRAZolam (XANAX) 0.5 MG tablet Take 1 tablet (0.5 mg total) by mouth at bedtime. 30 tablet 2   lamoTRIgine (LAMICTAL) 100 MG tablet TAKE 1 TABLET(100 MG) BY MOUTH DAILY 90 tablet 1   lamoTRIgine (LAMICTAL) 25 MG tablet Take 1 tablet (25 mg total) by mouth daily. 90 tablet 1   levonorgestrel (MIRENA) 20 MCG/24HR IUD 1 Intra Uterine Device (1 each total) by Intrauterine route once. 1 each 0   lisdexamfetamine (VYVANSE) 70 MG capsule Take 1 capsule (70 mg total) by mouth daily. 30 capsule 0   No facility-administered medications prior to visit.    Allergies  Allergen Reactions   Codeine Nausea And Vomiting and Other (See Comments)   Corticosteroids Other (See Comments)   Cyclobenzaprine Other (See Comments)   Depo-Medrol [Methylprednisolone Sodium Succ] Hives    Review of Systems As per HPI  PE:    12/18/2021    3:36 PM 11/09/2021   10:08 AM 09/12/2020    3:32 PM  Vitals with BMI  Height '5\' 6"'$  '5\' 6"'$  '5\' 5"'$   Weight 188 lbs 3 oz 184 lbs 10 oz 174 lbs 13 oz  BMI 30.39 56.38 75.64  Systolic 332 951  884  Diastolic 80 75 68  Pulse 80 76 81     Physical Exam  ***  LABS:  Last CBC Lab Results  Component Value Date   WBC 6.5 11/09/2021   HGB 13.7 11/09/2021   HCT 40.6 11/09/2021   MCV 91.3 11/09/2021   MCH 30.1 02/20/2019   RDW 13.3 11/09/2021   PLT 239.0 16/60/6301   Last metabolic panel Lab Results  Component Value Date   GLUCOSE 85 11/09/2021   NA 140 11/09/2021   K 4.2 11/09/2021   CL 103 11/09/2021   CO2 29 11/09/2021   BUN 8 11/09/2021   CREATININE 0.69 11/09/2021   CALCIUM 9.3 11/09/2021   PROT 7.0 11/09/2021   ALBUMIN 4.6 11/09/2021   BILITOT 1.3 (H) 11/09/2021   ALKPHOS 55 11/09/2021   AST 11 11/09/2021   ALT 9 11/09/2021   Last thyroid functions Lab Results  Component Value Date   TSH 1.69 11/09/2021   T3TOTAL 139 01/27/2018   IMPRESSION AND PLAN:  No problem-specific Assessment & Plan notes found for this encounter.  Daytrana? 20 Mydayis? (40-'50mg'$ ) An After Visit Summary was printed and given to the patient.  FOLLOW UP: No follow-ups on file.  Signed:  Crissie Sickles, MD  01/17/2022  

## 2022-01-17 NOTE — Telephone Encounter (Signed)
Pt could not make appointment today. She wanted to inform Dr. Anitra Lauth that the 70 mg Adderall is working fine and that he can call in a refill.

## 2022-01-18 NOTE — Telephone Encounter (Signed)
FYI

## 2022-01-23 MED ORDER — LISDEXAMFETAMINE DIMESYLATE 70 MG PO CAPS: 70.0000 mg | ORAL_CAPSULE | Freq: Every day | ORAL | 0 refills | Status: DC

## 2022-01-23 NOTE — Telephone Encounter (Signed)
Okay, Vyvanse 70 mg sent

## 2022-01-23 NOTE — Telephone Encounter (Signed)
Pt informed

## 2022-01-24 ENCOUNTER — Encounter: Payer: Self-pay | Admitting: Family Medicine

## 2022-02-26 ENCOUNTER — Ambulatory Visit (INDEPENDENT_AMBULATORY_CARE_PROVIDER_SITE_OTHER): Payer: 59 | Admitting: Family Medicine

## 2022-02-26 ENCOUNTER — Encounter: Payer: Self-pay | Admitting: Family Medicine

## 2022-02-26 VITALS — BP 126/83 | HR 80 | Ht 66.0 in | Wt 192.8 lb

## 2022-02-26 DIAGNOSIS — F988 Other specified behavioral and emotional disorders with onset usually occurring in childhood and adolescence: Secondary | ICD-10-CM | POA: Diagnosis not present

## 2022-02-26 DIAGNOSIS — F5104 Psychophysiologic insomnia: Secondary | ICD-10-CM | POA: Diagnosis not present

## 2022-02-26 DIAGNOSIS — E669 Obesity, unspecified: Secondary | ICD-10-CM

## 2022-02-26 DIAGNOSIS — Z79899 Other long term (current) drug therapy: Secondary | ICD-10-CM

## 2022-02-26 DIAGNOSIS — F411 Generalized anxiety disorder: Secondary | ICD-10-CM

## 2022-02-26 MED ORDER — ALPRAZOLAM 0.5 MG PO TABS: 0.5000 mg | ORAL_TABLET | Freq: Every day | ORAL | 2 refills | Status: DC

## 2022-02-26 MED ORDER — AMPHETAMINE-DEXTROAMPHETAMINE 10 MG PO TABS: 10.0000 mg | ORAL_TABLET | Freq: Every day | ORAL | 0 refills | Status: DC

## 2022-02-26 MED ORDER — AMPHETAMINE-DEXTROAMPHET ER 30 MG PO CP24: 30.0000 mg | ORAL_CAPSULE | ORAL | 0 refills | Status: DC

## 2022-02-26 NOTE — Progress Notes (Signed)
OFFICE VISIT  02/26/2022  CC:  Chief Complaint  Patient presents with   ADHD    Follow up; pt had insurance change so cost of vyvanse is more. She would like to discuss this.    Patient is a 47 y.o. female who presents for 31-monthfollow-up adult ADD. A/P as of last visit: "Adult ADD. Improved but not optimal efficacy or duration of effect on vyvanse 50 qd. Increase to '70mg'$  vyvanse qd.  Controlled substance contract reviewed with patient today.  Patient signed this and it will be placed in the chart."  INTERIM HX: Has done okay on the Vyvanse but insurer will not cover this anymore. She wants to get back on Adderall XR 30 mg every morning and 10 mg every afternoon--this seemed to help pretty well and is the most affordable with her insurance.  She is frustrated with inability to lose weight.  She works out regularly and is active.  She says her diet is reasonable.   PMP AWARE reviewed today: most recent rx for Vyvanse 70 mg was filled 01/24/2022, # 30, rx by me. Most recent alprazolam prescription filled 11/09/2021, #30, prescription by me. No red flags.  Past Medical History:  Diagnosis Date   Anxiety and depression    Dr. ARachel Moulds  Atopic dermatitis    Bipolar disease, chronic ([place]    BRBPR (bright red blood per rectum)    hemorrhoids   GERD (gastroesophageal reflux disease)    Melanoma (HCC)    ankle, calf, back of leg   PTSD (post-traumatic stress disorder)     Past Surgical History:  Procedure Laterality Date   TONSILLECTOMY AND ADENOIDECTOMY  2002   WISDOM TOOTH EXTRACTION      Outpatient Medications Prior to Visit  Medication Sig Dispense Refill   lamoTRIgine (LAMICTAL) 100 MG tablet TAKE 1 TABLET(100 MG) BY MOUTH DAILY 90 tablet 1   lamoTRIgine (LAMICTAL) 25 MG tablet Take 1 tablet (25 mg total) by mouth daily. 90 tablet 1   levonorgestrel (MIRENA) 20 MCG/24HR IUD 1 Intra Uterine Device (1 each total) by Intrauterine route once. 1 each 0    lisdexamfetamine (VYVANSE) 70 MG capsule Take 1 capsule (70 mg total) by mouth daily. 30 capsule 0   ALPRAZolam (XANAX) 0.5 MG tablet Take 1 tablet (0.5 mg total) by mouth at bedtime. 30 tablet 2   No facility-administered medications prior to visit.    Allergies  Allergen Reactions   Codeine Nausea And Vomiting and Other (See Comments)   Corticosteroids Other (See Comments)   Cyclobenzaprine Other (See Comments)   Depo-Medrol [Methylprednisolone Sodium Succ] Hives    Review of Systems As per HPI  PE:    02/26/2022    3:43 PM 12/18/2021    3:36 PM 11/09/2021   10:08 AM  Vitals with BMI  Height '5\' 6"'$  '5\' 6"'$  '5\' 6"'$   Weight 192 lbs 13 oz 188 lbs 3 oz 184 lbs 10 oz  BMI 31.13 [phone] Systolic [id number] Diastolic 83 80 75  Pulse 80 80 76     Physical Exam  Gen: Alert, well appearing.  Patient is oriented to person, place, time, and situation. AFFECT: pleasant, lucid thought and speech. No further exam today  LABS:  none  IMPRESSION AND PLAN:  #1 adult ADD. Her insurance does not cover Vyvanse anymore. Switch to Adderall XR 30 mg 1 q a.m., #30. Adderall IR/short acting 10 mg, 1 every afternoon, #30. UDS today  #2 anxiety-related insomnia.  Uses alprazolam sparingly. RF alpraz, #30, refill x 2. UDS today  #3 obesity class I. Patient is going to check with her employer and insurer about possible assistance with Ozempic or similar medication.  she will get back with me if prescription needed.  An After Visit Summary was printed and given to the patient.  FOLLOW UP: Return in about 3 months (around 05/28/2022) for f/u ADD.  Signed:  Crissie Sickles, MD           02/26/2022

## 2022-02-28 LAB — DM TEMPLATE

## 2022-03-01 LAB — DRUG MONITORING PANEL 376104, URINE
Amphetamine: 4171 ng/mL — ABNORMAL HIGH (ref <250)
Amphetamines: POSITIVE ng/mL — AB (ref <500)
Barbiturates: NEGATIVE ng/mL (ref <300)
Benzodiazepines: NEGATIVE ng/mL (ref <100)
Cocaine Metabolite: NEGATIVE ng/mL (ref <150)
Desmethyltramadol: NEGATIVE ng/mL (ref <100)
Methamphetamine: NEGATIVE ng/mL (ref <250)
Opiates: NEGATIVE ng/mL (ref <100)
Oxycodone: NEGATIVE ng/mL (ref <100)
Tramadol: NEGATIVE ng/mL (ref <100)

## 2022-03-01 LAB — DM TEMPLATE

## 2022-04-09 ENCOUNTER — Other Ambulatory Visit: Payer: Self-pay

## 2022-04-09 MED ORDER — AMPHETAMINE-DEXTROAMPHET ER 30 MG PO CP24: 30.0000 mg | ORAL_CAPSULE | ORAL | 0 refills | Status: DC

## 2022-04-09 MED ORDER — AMPHETAMINE-DEXTROAMPHETAMINE 10 MG PO TABS: 10.0000 mg | ORAL_TABLET | Freq: Every day | ORAL | 0 refills | Status: DC

## 2022-04-09 NOTE — Telephone Encounter (Signed)
Requesting: adderall '30mg'$  Contract: 02/21/22 UDS: 02/26/22 Last Visit: 02/26/22 Next Visit: 05/31/22 Last Refill: 02/26/22 (30,0)  Requesting: adderall '10mg'$  Contract: 02/21/22 UDS: 02/26/22 Last Visit: 02/26/22 Next Visit: 05/31/22 Last Refill: 02/26/22 (30,0)  Please Advise. Meds pending

## 2022-04-09 NOTE — Telephone Encounter (Signed)
Patient refill request.  [facility] - [place]  amphetamine-dextroamphetamine (ADDERALL) 10 MG tablet   amphetamine-dextroamphetamine (ADDERALL XR) 30 MG 24 hr capsule

## 2022-05-23 ENCOUNTER — Other Ambulatory Visit: Payer: Self-pay

## 2022-05-23 MED ORDER — AMPHETAMINE-DEXTROAMPHETAMINE 10 MG PO TABS: 10.0000 mg | ORAL_TABLET | Freq: Every day | ORAL | 0 refills | Status: DC

## 2022-05-23 MED ORDER — AMPHETAMINE-DEXTROAMPHET ER 30 MG PO CP24: 30.0000 mg | ORAL_CAPSULE | ORAL | 0 refills | Status: DC

## 2022-05-23 NOTE — Telephone Encounter (Signed)
Requesting: adderall  Contract: 02/21/22 UDS: 02/26/22 Last Visit: 02/26/22 Next Visit: 05/31/22 Last Refill: 04/09/22 (30,0)   Requesting: adderall  Contract: 02/21/22 UDS: 02/26/22 Last Visit: 02/26/22 Next Visit: 05/31/22 Last Refill: 04/09/22 (30,0)  Please Advise. Meds pending

## 2022-05-23 NOTE — Telephone Encounter (Signed)
Patient refill request.  Patient has verified with pharmacy they have both meds in stock. [facility] - [place]  amphetamine-dextroamphetamine (ADDERALL XR) 30 MG 24 hr capsule   amphetamine-dextroamphetamine (ADDERALL) 10 MG tablet

## 2022-05-31 ENCOUNTER — Ambulatory Visit: Payer: 59 | Admitting: Family Medicine

## 2022-05-31 NOTE — Progress Notes (Deleted)
OFFICE VISIT  05/31/2022  CC: No chief complaint on file.   Patient is a 47 y.o. female who presents for 62-month follow-up adult ADD and insomnia. A/P as of last visit: "#1 adult ADD. Her insurance does not cover Vyvanse anymore. Switch to Adderall XR 30 mg 1 q a.m., #30. Adderall IR/short acting 10 mg, 1 every afternoon, #30. UDS today   #2 anxiety-related insomnia.  Uses alprazolam sparingly. RF alpraz, #30, refill x 2. UDS today   #3 obesity class I. Patient is going to check with her employer and insurer about possible assistance with Ozempic or similar medication.  she will get back with me if prescription needed."  INTERIM HX: ***   PMP AWARE reviewed today: most recent rx for Adderall XR 30 mg and Adderall IR 10 mg was filled 05/23/2022, # 30 of each, rx by me. Most recent alprazolam prescription filled 02/26/2022, #30, prescription by me. No red flags.   Past Medical History:  Diagnosis Date   Anxiety and depression    Dr. Marisue Brooklyn   Atopic dermatitis    Bipolar disease, chronic (HCC)    BRBPR (bright red blood per rectum)    hemorrhoids   GERD (gastroesophageal reflux disease)    Melanoma (HCC)    ankle, calf, back of leg   PTSD (post-traumatic stress disorder)     Past Surgical History:  Procedure Laterality Date   TONSILLECTOMY AND ADENOIDECTOMY  2002   WISDOM TOOTH EXTRACTION      Outpatient Medications Prior to Visit  Medication Sig Dispense Refill   ALPRAZolam (XANAX) 0.5 MG tablet Take 1 tablet (0.5 mg total) by mouth at bedtime. 30 tablet 2   amphetamine-dextroamphetamine (ADDERALL XR) 30 MG 24 hr capsule Take 1 capsule (30 mg total) by mouth every morning. 30 capsule 0   amphetamine-dextroamphetamine (ADDERALL) 10 MG tablet Take 1 tablet (10 mg total) by mouth daily with breakfast. 30 tablet 0   lamoTRIgine (LAMICTAL) 100 MG tablet TAKE 1 TABLET(100 MG) BY MOUTH DAILY 90 tablet 1   lamoTRIgine (LAMICTAL) 25 MG tablet Take 1 tablet (25 mg  total) by mouth daily. 90 tablet 1   levonorgestrel (MIRENA) 20 MCG/24HR IUD 1 Intra Uterine Device (1 each total) by Intrauterine route once. 1 each 0   No facility-administered medications prior to visit.    Allergies  Allergen Reactions   Codeine Nausea And Vomiting and Other (See Comments)   Corticosteroids Other (See Comments)   Cyclobenzaprine Other (See Comments)   Depo-Medrol [Methylprednisolone Sodium Succ] Hives    Review of Systems As per HPI  PE:    02/26/2022    3:43 PM 12/18/2021    3:36 PM 11/09/2021   10:08 AM  Vitals with BMI  Height 5\' 6"  5\' 6"  5\' 6"   Weight 192 lbs 13 oz 188 lbs 3 oz 184 lbs 10 oz  BMI 31.13 30.39 29.81  Systolic 126 116 161  Diastolic 83 80 75  Pulse 80 80 76     Physical Exam  ***  LABS:  {Labs (Optional):23779}  IMPRESSION AND PLAN:  No problem-specific Assessment & Plan notes found for this encounter.   An After Visit Summary was printed and given to the patient.  FOLLOW UP: No follow-ups on file.  @esig @

## 2022-06-03 ENCOUNTER — Other Ambulatory Visit: Payer: Self-pay | Admitting: Family Medicine

## 2022-06-06 ENCOUNTER — Ambulatory Visit: Payer: 59 | Admitting: Family Medicine

## 2022-06-14 ENCOUNTER — Encounter: Payer: Self-pay | Admitting: Family Medicine

## 2022-06-26 ENCOUNTER — Other Ambulatory Visit: Payer: Self-pay | Admitting: Family Medicine

## 2022-06-26 NOTE — Telephone Encounter (Signed)
Pt is currently due for follow up to receive refills.  LVM for pt to return call.

## 2022-06-26 NOTE — Telephone Encounter (Signed)
Patient requesting refill on both adderall medications amphetamine-dextroamphetamine (ADDERALL) 10 MG table ,  amphetamine-dextroamphetamine (ADDERALL XR) 30 MG 24 hr capsule   Pharmacy is [facility] in [place]

## 2022-06-29 MED ORDER — AMPHETAMINE-DEXTROAMPHET ER 30 MG PO CP24: 30.0000 mg | ORAL_CAPSULE | ORAL | 0 refills | Status: DC

## 2022-06-29 MED ORDER — AMPHETAMINE-DEXTROAMPHETAMINE 10 MG PO TABS: 10.0000 mg | ORAL_TABLET | Freq: Every day | ORAL | 0 refills | Status: DC

## 2022-06-29 NOTE — Telephone Encounter (Signed)
Contract: 02/21/22 UDS: 02/26/22 Last Visit: 02/26/22 Next Visit: 07/19/22 Last Refill: 05/23/22( 30,0) for both dosages.  Please Advise. Meds pending

## 2022-06-29 NOTE — Telephone Encounter (Signed)
Patient is scheduled for medication refills for 6/20. She has had difficult scheduling due to work schedule and we canceled her original appointment due to the office close when plumbing issues.  She is asking for a temp fill on her Adderall 10 and 30 mg . Pharmacy is [facility] in [place].  Patient is out of medication completely.

## 2022-07-19 ENCOUNTER — Ambulatory Visit: Payer: 59 | Admitting: Family Medicine

## 2022-07-19 NOTE — Progress Notes (Deleted)
OFFICE VISIT  07/19/2022  CC: No chief complaint on file.   Patient is a 47 y.o. female who presents for 6 mo f/u adult ADD and anxiety, high risk med use. A/P as of last visit: "1 adult ADD. Her insurance does not cover Vyvanse anymore. Switch to Adderall XR 30 mg 1 q a.m., #30. Adderall IR/short acting 10 mg, 1 every afternoon, #30. UDS today   #2 anxiety-related insomnia.  Uses alprazolam sparingly. RF alpraz, #30, refill x 2. UDS today   #3 obesity class I. Patient is going to check with her employer and insurer about possible assistance with Ozempic or similar medication.  she will get back with me if prescription needed."  INTERIM HX: ***   PMP AWARE reviewed today: most recent rx for adderall 10mg  IR was filled 06/30/22, # 30, rx by me.  Most recent adderall xr 30mg  rx filled 06/29/22, #30, rx by me. No red flags.  Past Medical History:  Diagnosis Date   Anxiety and depression    Dr. Marisue Brooklyn   Atopic dermatitis    Bipolar disease, chronic (HCC)    BRBPR (bright red blood per rectum)    hemorrhoids   GERD (gastroesophageal reflux disease)    Melanoma (HCC)    ankle, calf, back of leg   PTSD (post-traumatic stress disorder)     Past Surgical History:  Procedure Laterality Date   TONSILLECTOMY AND ADENOIDECTOMY  2002   WISDOM TOOTH EXTRACTION      Outpatient Medications Prior to Visit  Medication Sig Dispense Refill   ALPRAZolam (XANAX) 0.5 MG tablet Take 1 tablet (0.5 mg total) by mouth at bedtime. 30 tablet 2   amphetamine-dextroamphetamine (ADDERALL XR) 30 MG 24 hr capsule Take 1 capsule (30 mg total) by mouth every morning. 30 capsule 0   amphetamine-dextroamphetamine (ADDERALL) 10 MG tablet Take 1 tablet (10 mg total) by mouth daily with breakfast. 30 tablet 0   lamoTRIgine (LAMICTAL) 100 MG tablet TAKE 1 TABLET(100 MG) BY MOUTH DAILY 30 tablet 0   lamoTRIgine (LAMICTAL) 25 MG tablet TAKE 1 TABLET(25 MG) BY MOUTH DAILY 30 tablet 0   levonorgestrel  (MIRENA) 20 MCG/24HR IUD 1 Intra Uterine Device (1 each total) by Intrauterine route once. 1 each 0   No facility-administered medications prior to visit.    Allergies  Allergen Reactions   Codeine Nausea And Vomiting and Other (See Comments)   Corticosteroids Other (See Comments)   Cyclobenzaprine Other (See Comments)   Depo-Medrol [Methylprednisolone Sodium Succ] Hives    Review of Systems As per HPI  PE:    02/26/2022    3:43 PM 12/18/2021    3:36 PM 11/09/2021   10:08 AM  Vitals with BMI  Height 5\' 6"  5\' 6"  5\' 6"   Weight 192 lbs 13 oz 188 lbs 3 oz 184 lbs 10 oz  BMI 31.13 30.39 29.81  Systolic 126 116 161  Diastolic 83 80 75  Pulse 80 80 76     Physical Exam  ***  LABS:  {Labs (Optional):23779}  IMPRESSION AND PLAN:  No problem-specific Assessment & Plan notes found for this encounter.   An After Visit Summary was printed and given to the patient.  FOLLOW UP: No follow-ups on file.  @esig @

## 2022-07-31 ENCOUNTER — Telehealth: Payer: Self-pay

## 2022-07-31 MED ORDER — AMPHETAMINE-DEXTROAMPHET ER 30 MG PO CP24: 30.0000 mg | ORAL_CAPSULE | ORAL | 0 refills | Status: DC

## 2022-07-31 MED ORDER — AMPHETAMINE-DEXTROAMPHETAMINE 10 MG PO TABS: 10.0000 mg | ORAL_TABLET | Freq: Every day | ORAL | 0 refills | Status: DC

## 2022-07-31 NOTE — Addendum Note (Signed)
Addended by: Jeoffrey Massed on: 07/31/2022 04:24 PM   Modules accepted: Orders

## 2022-07-31 NOTE — Telephone Encounter (Signed)
Pt will need refill on Adderall next week sent to [facility] in [place]. Pt missed last OV due to allergic reaction to something but she is rescheduled for 7/29

## 2022-07-31 NOTE — Telephone Encounter (Signed)
Ok adderall rx's sent

## 2022-08-10 ENCOUNTER — Encounter: Payer: Self-pay | Admitting: Family Medicine

## 2022-08-27 ENCOUNTER — Encounter: Payer: Self-pay | Admitting: Family Medicine

## 2022-08-27 ENCOUNTER — Ambulatory Visit: Payer: 59 | Admitting: Family Medicine

## 2022-08-27 VITALS — BP 114/70 | HR 78 | Wt 199.0 lb

## 2022-08-27 DIAGNOSIS — F331 Major depressive disorder, recurrent, moderate: Secondary | ICD-10-CM | POA: Diagnosis not present

## 2022-08-27 DIAGNOSIS — F319 Bipolar disorder, unspecified: Secondary | ICD-10-CM

## 2022-08-27 DIAGNOSIS — F411 Generalized anxiety disorder: Secondary | ICD-10-CM | POA: Diagnosis not present

## 2022-08-27 MED ORDER — AMPHETAMINE-DEXTROAMPHET ER 30 MG PO CP24: 30.0000 mg | ORAL_CAPSULE | ORAL | 0 refills | Status: DC

## 2022-08-27 MED ORDER — AMPHETAMINE-DEXTROAMPHETAMINE 10 MG PO TABS: 10.0000 mg | ORAL_TABLET | Freq: Every day | ORAL | 0 refills | Status: DC

## 2022-08-27 MED ORDER — LAMOTRIGINE 200 MG PO TABS
200.0000 mg | ORAL_TABLET | Freq: Every day | ORAL | 1 refills | Status: DC
Start: 2022-08-27 — End: 2022-10-08

## 2022-08-27 MED ORDER — ALPRAZOLAM 0.5 MG PO TABS: 0.5000 mg | ORAL_TABLET | Freq: Every day | ORAL | 2 refills | Status: AC

## 2022-08-27 NOTE — Progress Notes (Signed)
OFFICE VISIT  08/27/2022  CC:  Chief Complaint  Patient presents with   Medication Management    Pt states she is still feeling anxious a lot, irritable.     Patient is a 47 y.o. female who presents for 26-month follow-up ADD and anxiety-related insomnia. A/P as of last visit: "#1 adult ADD. Her insurance does not cover Vyvanse anymore. Switch to Adderall XR 30 mg 1 q a.m., #30. Adderall IR/short acting 10 mg, 1 every afternoon, #30. UDS today   #2 anxiety-related insomnia.  Uses alprazolam sparingly. RF alpraz, #30, refill x 2. UDS today   #3 obesity class I. Patient is going to check with her employer and insurer about possible assistance with Ozempic or similar medication.  she will get back with me if prescription needed."  INTERIM HX: Sophy feels anxious, depressed, and overwhelmed. Suffering lots of self-esteem problems primarily due to her gradual weight gain/obesity. Frustrated because she feels like she is very active and hardly eats anything yet continues to gain weight. Feels like she is irritable, mood is down most of the time.  She wants to isolate because she hates what she looks like.  Constantly worries excessively about her 2 kids, age 82 and age 39. Having some relationship problems with husband, is currently looking for marriage counseling.  ADD:  Pt states all is going well with the med at current dosing (Adderall XR 30 mg every morning and Adderall 10 mg q. afternoon: much improved focus, concentration, task completion.  Less frustration, better multitasking, less impulsivity and restlessness.  Mood is stable. No side effects from the medication.   PMP AWARE reviewed today: most recent rx for Adderall XR 30 mg was filled 08/06/2022, # 30, rx by me. Most recent prescription for Adderall IR 10 mg filled 07/31/2022, #30, prescription by me. Most recent alprazolam prescription filled 02/26/2022, #30, prescription by me. No red flags.  ROS as above, plus--> no  fevers, no CP, no SOB, no wheezing, no cough, no dizziness, no HAs, no rashes, no melena/hematochezia.  No polyuria or polydipsia.  No myalgias or arthralgias.  No focal weakness, paresthesias, or tremors.  No acute vision or hearing abnormalities.  No dysuria or unusual/new urinary urgency or frequency.  No recent changes in lower legs. No n/v/d or abd pain.  No palpitations.    Past Medical History:  Diagnosis Date   Anxiety and depression    Dr. Marisue Brooklyn   Atopic dermatitis    Bipolar disease, chronic (HCC)    BRBPR (bright red blood per rectum)    hemorrhoids   GERD (gastroesophageal reflux disease)    Melanoma (HCC)    ankle, calf, back of leg   PTSD (post-traumatic stress disorder)     Past Surgical History:  Procedure Laterality Date   TONSILLECTOMY AND ADENOIDECTOMY  2002   WISDOM TOOTH EXTRACTION      Outpatient Medications Prior to Visit  Medication Sig Dispense Refill   ALPRAZolam (XANAX) 0.5 MG tablet Take 1 tablet (0.5 mg total) by mouth at bedtime. 30 tablet 2   amphetamine-dextroamphetamine (ADDERALL XR) 30 MG 24 hr capsule Take 1 capsule (30 mg total) by mouth every morning. 30 capsule 0   amphetamine-dextroamphetamine (ADDERALL) 10 MG tablet Take 1 tablet (10 mg total) by mouth daily with breakfast. 30 tablet 0   lamoTRIgine (LAMICTAL) 100 MG tablet TAKE 1 TABLET(100 MG) BY MOUTH DAILY 30 tablet 0   lamoTRIgine (LAMICTAL) 25 MG tablet TAKE 1 TABLET(25 MG) BY MOUTH DAILY 30  tablet 0   levonorgestrel (MIRENA) 20 MCG/24HR IUD 1 Intra Uterine Device (1 each total) by Intrauterine route once. 1 each 0   No facility-administered medications prior to visit.    Allergies  Allergen Reactions   Codeine Nausea And Vomiting and Other (See Comments)   Corticosteroids Other (See Comments)   Cyclobenzaprine Other (See Comments)   Depo-Medrol [Methylprednisolone Sodium Succ] Hives    Review of Systems As per HPI  PE:    08/27/2022   10:26 AM 02/26/2022    3:43 PM  12/18/2021    3:36 PM  Vitals with BMI  Height  5\' 6"  5\' 6"   Weight 199 lbs 192 lbs 13 oz 188 lbs 3 oz  BMI  31.13 30.39  Systolic 114 126 161  Diastolic 70 83 80  Pulse 78 80 80     Physical Exam  Gen: Alert, well appearing.  Patient is oriented to person, place, time, and situation. AFFECT: pleasant, anxious, tearful, lucid thought and speech. No further exam today  LABS:  Last metabolic panel Lab Results  Component Value Date   GLUCOSE 85 11/09/2021   NA 140 11/09/2021   K 4.2 11/09/2021   CL 103 11/09/2021   CO2 29 11/09/2021   BUN 8 11/09/2021   CREATININE 0.69 11/09/2021   GFR 105.02 11/09/2021   CALCIUM 9.3 11/09/2021   PROT 7.0 11/09/2021   ALBUMIN 4.6 11/09/2021   BILITOT 1.3 (H) 11/09/2021   ALKPHOS 55 11/09/2021   AST 11 11/09/2021   ALT 9 11/09/2021     IMPRESSION AND PLAN:  #1 bipolar depression. Increase Lamictal to 200 mg a day. (Of note, she has had worsening on trials of SSRIs in the past). Refer to psychologist for counseling.  #2 GAD. She has alprazolam 0.5 mg to use daily as needed. She does not use this very much. (Of note, she has had worsening on trials of SSRIs in the past).  An After Visit Summary was printed and given to the patient.  FOLLOW UP: No follow-ups on file. Next CPE 10/2022 Signed:  Santiago Bumpers, MD           08/27/2022

## 2022-09-13 ENCOUNTER — Encounter (INDEPENDENT_AMBULATORY_CARE_PROVIDER_SITE_OTHER): Payer: Self-pay

## 2022-09-30 ENCOUNTER — Other Ambulatory Visit: Payer: Self-pay | Admitting: Family Medicine

## 2022-10-08 ENCOUNTER — Ambulatory Visit (INDEPENDENT_AMBULATORY_CARE_PROVIDER_SITE_OTHER): Payer: 59 | Admitting: Family Medicine

## 2022-10-08 ENCOUNTER — Encounter: Payer: Self-pay | Admitting: Family Medicine

## 2022-10-08 VITALS — BP 131/85 | HR 87 | Temp 98.8°F | Ht 66.0 in | Wt 204.4 lb

## 2022-10-08 DIAGNOSIS — F4310 Post-traumatic stress disorder, unspecified: Secondary | ICD-10-CM

## 2022-10-08 DIAGNOSIS — F3170 Bipolar disorder, currently in remission, most recent episode unspecified: Secondary | ICD-10-CM | POA: Diagnosis not present

## 2022-10-08 DIAGNOSIS — Z1231 Encounter for screening mammogram for malignant neoplasm of breast: Secondary | ICD-10-CM

## 2022-10-08 DIAGNOSIS — Z1211 Encounter for screening for malignant neoplasm of colon: Secondary | ICD-10-CM

## 2022-10-08 DIAGNOSIS — F411 Generalized anxiety disorder: Secondary | ICD-10-CM | POA: Diagnosis not present

## 2022-10-08 MED ORDER — LAMOTRIGINE 200 MG PO TABS: 200.0000 mg | ORAL_TABLET | Freq: Every day | ORAL | 1 refills | Status: DC

## 2022-10-08 MED ORDER — AMPHETAMINE-DEXTROAMPHETAMINE 10 MG PO TABS: 10.0000 mg | ORAL_TABLET | Freq: Every day | ORAL | 0 refills | Status: DC

## 2022-10-08 MED ORDER — AMPHETAMINE-DEXTROAMPHET ER 30 MG PO CP24: 30.0000 mg | ORAL_CAPSULE | ORAL | 0 refills | Status: DC

## 2022-10-08 NOTE — Progress Notes (Signed)
OFFICE VISIT  10/08/2022  CC:  Chief Complaint  Patient presents with   Medical Management of Chronic Issues    Anxiety/depression follow up; last visit Lamotrigine increased to 200mg . Pt has not had psych appt yet, referral placed 7/29. Referral closed due to incomplete NPP    Patient is a 47 y.o. female who presents for 6-week follow-up bipolar disorder and anxiety. A/P as of last visit: "#1 bipolar depression. Increase Lamictal to 200 mg a day. (Of note, she has had worsening on trials of SSRIs in the past). Refer to psychologist for counseling.   #2 GAD. She has alprazolam 0.5 mg to use daily as needed. She does not use this very much. (Of note, she has had worsening on trials of SSRIs in the past)."  INTERIM HX: She has noted that since increasing the Lamictal she no longer has the horrible dreams about her children dying. She still fights back constant severe anxiety about their health and safety and fear of leaving her. She also is very irritated by her husband lately although he has not been doing anything to really irritate her. She is constantly frustrated and trying to keep things in.  She keeps very busy but does not feel like she is enjoying much in her life. She says this all relates back to horrible trauma she went through at age 71.  Her cousin was violently murdered. She states she never really dealt with this problem.  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 She quit using drugs and got married and had a baby and all the severe fears about safety of her children began at that time.  No pressured speech, decreased need for sleep, erratic behavior, or other type of manic behaviors. No alcohol or drug use.  She does use alprazolam as needed and this is very helpful.  PMP AWARE reviewed today: most recent rx for Adderall XR 30 mg was filled 09/11/2022, # 30, rx by me. Most recent Adderall 10 mg prescription filled 08/27/2022, #30, prescription by me. Most recent  alprazolam 0.5 mg prescription was filled 08/27/2022, #30, prescription by me. No red flags.  Past Medical History:  Diagnosis Date   Anxiety and depression    Dr. Marisue Brooklyn   Atopic dermatitis    Bipolar disease, chronic (HCC)    BRBPR (bright red blood per rectum)    hemorrhoids   GERD (gastroesophageal reflux disease)    Melanoma (HCC)    ankle, calf, back of leg   PTSD (post-traumatic stress disorder)     Past Surgical History:  Procedure Laterality Date   TONSILLECTOMY AND ADENOIDECTOMY  2002   WISDOM TOOTH EXTRACTION      Outpatient Medications Prior to Visit  Medication Sig Dispense Refill   ALPRAZolam (XANAX) 0.5 MG tablet Take 1 tablet (0.5 mg total) by mouth at bedtime. 30 tablet 2   levonorgestrel (MIRENA) 20 MCG/24HR IUD 1 Intra Uterine Device (1 each total) by Intrauterine route once. 1 each 0   amphetamine-dextroamphetamine (ADDERALL XR) 30 MG 24 hr capsule Take 1 capsule (30 mg total) by mouth every morning. 30 capsule 0   amphetamine-dextroamphetamine (ADDERALL) 10 MG tablet Take 1 tablet (10 mg total) by mouth daily with breakfast. 30 tablet 0   lamoTRIgine (LAMICTAL) 200 MG tablet Take 1 tablet (200 mg total) by mouth daily. 30 tablet 1   No facility-administered medications prior to visit.    Allergies  Allergen Reactions   Codeine Nausea And Vomiting and Other (See  Comments)   Corticosteroids Other (See Comments)   Cyclobenzaprine Other (See Comments)   Depo-Medrol [Methylprednisolone Sodium Succ] Hives    Review of Systems As per HPI  PE:    10/08/2022    3:44 PM 08/27/2022   10:26 AM 02/26/2022    3:43 PM  Vitals with BMI  Height 5\' 6"   5\' 6"   Weight 204 lbs 6 oz 199 lbs 192 lbs 13 oz  BMI 33.01  31.13  Systolic 131 114 161  Diastolic 85 70 83  Pulse 87 78 80     Physical Exam  Gen: Alert, well appearing.  Patient is oriented to person, place, time, and situation. AFFECT: pleasant, lucid thought and speech. She gets tearful when  discussing the fears she has, the reliving of her trauma, etc.  LABS:  none  IMPRESSION AND PLAN:  PTSD. Question of bipolar disorder, but not clear. GAD.  Her symptoms primarily are irritability and mood swings that seem to be occurring more and more over time as she keeps emotions pent up. She has counseling that starts later this month. We will keep her medications the same for now: Lamictal 200 mg a day. Xanax 0.5 mg daily.  Spent 31 min with pt today reviewing HPI, reviewing relevant past history, doing exam, reviewing and discussing lab and imaging data, and formulating plans.  An After Visit Summary was printed and given to the patient.  FOLLOW UP: Return in about 3 months (around 01/07/2023) for annual CPE (fasting)+f/u anx/dep/add. Next CPE 10/2022. Signed:  Santiago Bumpers, MD           10/08/2022

## 2022-11-20 ENCOUNTER — Other Ambulatory Visit: Payer: Self-pay

## 2022-11-20 NOTE — Telephone Encounter (Signed)
Prescription Request  11/20/2022  LOV: Visit date not found  What is the name of the medication or equipment?  -amphetamine-dextroamphetamine (ADDERALL XR) 30 MG 24 hr capsule -amphetamine-dextroamphetamine (ADDERALL) 10 MG tablet  -lamoTRIgine (LAMICTAL) 200 MG tablet  Have you contacted your pharmacy to request a refill? Yes   Which pharmacy would you like this sent to?  [id number] - [place], [place] - [address] [place] [address] Phone: [phone] Fax: [phone]    Patient notified that their request is being sent to the clinical staff for review and that they should receive a response within 2 business days.   Please advise at Mobile [phone] (mobile)

## 2022-11-21 ENCOUNTER — Inpatient Hospital Stay: Admission: RE | Admit: 2022-11-21 | Payer: 59 | Source: Ambulatory Visit

## 2022-11-21 MED ORDER — AMPHETAMINE-DEXTROAMPHETAMINE 10 MG PO TABS: 10.0000 mg | ORAL_TABLET | Freq: Every day | ORAL | 0 refills | Status: DC

## 2022-11-21 MED ORDER — AMPHETAMINE-DEXTROAMPHET ER 30 MG PO CP24: 30.0000 mg | ORAL_CAPSULE | ORAL | 0 refills | Status: DC

## 2022-11-21 NOTE — Telephone Encounter (Signed)
Requesting: amphetamine-dextroamphetamine (ADDERALL XR) 30 MG 24 hr capsule  Contract: N/A for this med UDS: 02/26/22 Last Visit: 10/08/22 Next Visit: 01/07/23 Last Refill: 10/08/22 (30,0)  Requesting: amphetamine-dextroamphetamine (ADDERALL) 10 MG tablet  Contract: N/A for this med UDS: 02/26/22 Last Visit: 10/08/22 Next Visit: 01/07/23 Last Refill: 10/08/22 (30,0)  RF request for lamotrigine (LAMICTAL) 200 MG tablet  LOV: 10/08/22 Next ov: 01/07/23 Last written: 10/08/22 (90,1) RF too early  Please advise. Meds pending

## 2022-12-26 ENCOUNTER — Telehealth: Payer: Self-pay

## 2022-12-26 MED ORDER — AMPHETAMINE-DEXTROAMPHET ER 30 MG PO CP24: 30.0000 mg | ORAL_CAPSULE | ORAL | 0 refills | Status: DC

## 2022-12-26 NOTE — Telephone Encounter (Signed)
Prescription Request  12/26/2022  LOV: Visit date not found  What is the name of the medication or equipment?  1) amphetamine-dextroamphetamine (ADDERALL XR) 30 MG 24 hr capsule  2) amphetamine-dextroamphetamine (ADDERALL) 10 MG tablet   Have you contacted your pharmacy to request a refill? No   Which pharmacy would you like this sent to?  [id number] - [place], [place] - [address] [place] [address] Phone: [phone] Fax: [phone]    Patient notified that their request is being sent to the clinical staff for review and that they should receive a response within 2 business days.   Please advise at Mobile [phone] (mobile)

## 2022-12-26 NOTE — Telephone Encounter (Signed)
Okay, prescription sent 

## 2023-01-07 ENCOUNTER — Ambulatory Visit: Payer: 59 | Admitting: Family Medicine

## 2023-01-07 ENCOUNTER — Encounter: Payer: Self-pay | Admitting: Family Medicine

## 2023-01-07 VITALS — BP 128/84 | HR 79 | Wt 205.6 lb

## 2023-01-07 DIAGNOSIS — F988 Other specified behavioral and emotional disorders with onset usually occurring in childhood and adolescence: Secondary | ICD-10-CM

## 2023-01-07 DIAGNOSIS — F3170 Bipolar disorder, currently in remission, most recent episode unspecified: Secondary | ICD-10-CM

## 2023-01-07 DIAGNOSIS — F411 Generalized anxiety disorder: Secondary | ICD-10-CM

## 2023-01-07 MED ORDER — AMPHETAMINE-DEXTROAMPHETAMINE 10 MG PO TABS: 10.0000 mg | ORAL_TABLET | Freq: Every day | ORAL | 0 refills | Status: DC

## 2023-01-07 NOTE — Progress Notes (Signed)
OFFICE VISIT  01/07/2023  CC:  Chief Complaint  Patient presents with   Anxiety    Patient is a 47 y.o. female who presents for 1-month follow-up bipolar depression and GAD. A/P as of last visit: "#1 bipolar depression. Increase Lamictal to 200 mg a day. (Of note, she has had worsening on trials of SSRIs in the past). Refer to psychologist for counseling.   #2 GAD. She has alprazolam 0.5 mg to use daily as needed. She does not use this very much. (Of note, she has had worsening on trials of SSRIs in the past)."  INTERIM HX: Karrie is doing very well. Mood and anxiety levels are stable.  She rarely uses alprazolam.  Pt states all is going well with the med at current dosing: much improved focus, concentration, task completion.  Less frustration, better multitasking, less impulsivity and restlessness.  Mood is stable. No side effects from the medication.  PMP AWARE reviewed today: most recent rx for Adderall XR 30 mg was filled 12/30/2022, # 30, rx by me.  Most recent prescription for Adderall IR 10 mg tabs was 11/21/2022, #30.  Most recent prescription for alprazolam was filled 12/19/2022, #30, prescription by me. No red flags.   Past Medical History:  Diagnosis Date   Anxiety and depression    Dr. Marisue Brooklyn   Atopic dermatitis    Bipolar disease, chronic (HCC)    BRBPR (bright red blood per rectum)    hemorrhoids   GERD (gastroesophageal reflux disease)    Melanoma (HCC)    ankle, calf, back of leg   PTSD (post-traumatic stress disorder)     Past Surgical History:  Procedure Laterality Date   TONSILLECTOMY AND ADENOIDECTOMY  2002   WISDOM TOOTH EXTRACTION      Outpatient Medications Prior to Visit  Medication Sig Dispense Refill   ALPRAZolam (XANAX) 0.5 MG tablet Take 1 tablet (0.5 mg total) by mouth at bedtime. 30 tablet 2   amphetamine-dextroamphetamine (ADDERALL XR) 30 MG 24 hr capsule Take 1 capsule (30 mg total) by mouth every morning. 30 capsule 0    lamoTRIgine (LAMICTAL) 200 MG tablet Take 1 tablet (200 mg total) by mouth daily. 90 tablet 1   levonorgestrel (MIRENA) 20 MCG/24HR IUD 1 Intra Uterine Device (1 each total) by Intrauterine route once. 1 each 0   amphetamine-dextroamphetamine (ADDERALL) 10 MG tablet Take 1 tablet (10 mg total) by mouth daily with breakfast. 30 tablet 0   No facility-administered medications prior to visit.    Allergies  Allergen Reactions   Codeine Nausea And Vomiting and Other (See Comments)   Corticosteroids Other (See Comments)   Cyclobenzaprine Other (See Comments)   Depo-Medrol [Methylprednisolone Sodium Succ] Hives    Review of Systems As per HPI  PE:    01/07/2023    3:41 PM 10/08/2022    3:44 PM 08/27/2022   10:26 AM  Vitals with BMI  Height  5\' 6"    Weight 205 lbs 10 oz 204 lbs 6 oz 199 lbs  BMI  33.01   Systolic 128 131 425  Diastolic 84 85 70  Pulse 79 87 78     Physical Exam  Gen: Alert, well appearing.  Patient is oriented to person, place, time, and situation. AFFECT: pleasant, lucid thought and speech. No further exam today  LABS:  Last CBC Lab Results  Component Value Date   WBC 6.5 11/09/2021   HGB 13.7 11/09/2021   HCT 40.6 11/09/2021   MCV 91.3 11/09/2021  MCH 30.1 02/20/2019   RDW 13.3 11/09/2021   PLT 239.0 11/09/2021   Last metabolic panel Lab Results  Component Value Date   GLUCOSE 85 11/09/2021   NA 140 11/09/2021   K 4.2 11/09/2021   CL 103 11/09/2021   CO2 29 11/09/2021   BUN 8 11/09/2021   CREATININE 0.69 11/09/2021   GFR 105.02 11/09/2021   CALCIUM 9.3 11/09/2021   PROT 7.0 11/09/2021   ALBUMIN 4.6 11/09/2021   BILITOT 1.3 (H) 11/09/2021   ALKPHOS 55 11/09/2021   AST 11 11/09/2021   ALT 9 11/09/2021   Last lipids Lab Results  Component Value Date   CHOL 166 11/09/2021   HDL 49.90 11/09/2021   LDLCALC 97 11/09/2021   TRIG 94.0 11/09/2021   CHOLHDL 3 11/09/2021   Last hemoglobin A1c Lab Results  Component Value Date   HGBA1C 5.4  09/12/2020   Last thyroid functions Lab Results  Component Value Date   TSH 1.69 11/09/2021   T3TOTAL 139 01/27/2018   IMPRESSION AND PLAN:  Bipolar depression, GAD, and adult ADD. Doing well long-term on Adderall XR 30 mg every morning as well as Adderall IR 10 mg once daily. Also continue Lamictal 200 mg a day. She will use alprazolam 0.5 mg, 1 twice daily as needed.  A new prescription was not needed for this medication today.  An After Visit Summary was printed and given to the patient.  FOLLOW UP: Return for 6 mo (or sooner at her convenience) for CPE. Due for CPE Signed:  Santiago Bumpers, MD           01/07/2023

## 2023-02-13 ENCOUNTER — Other Ambulatory Visit: Payer: Self-pay | Admitting: Family Medicine

## 2023-02-13 NOTE — Telephone Encounter (Signed)
 Copied from CRM [id number]. Topic: Clinical - Medication Refill >> Feb 13, 2023  3:48 PM Chuck Crater wrote: Most Recent Primary Care Visit:  Provider: Shelvia Dick  Department: [facility]  Visit Type: OFFICE VISIT  Date: 01/07/2023  Medication: ***  Has the patient contacted their pharmacy?  (Agent: If no, request that the patient contact the pharmacy for the refill. If patient does not wish to contact the pharmacy document the reason why and proceed with request.) (Agent: If yes, when and what did the pharmacy advise?)  Is this the correct pharmacy for this prescription?  If no, delete pharmacy and type the correct one.  This is the patient's preferred pharmacy:  [facility] #[id number] - [place], [place] - [address] [place] [address] Phone: [phone] Fax: [phone]   Has the prescription been filled recently?   Is the patient out of the medication?   Has the patient been seen for an appointment in the last year OR does the patient have an upcoming appointment?   Can we respond through MyChart?   Agent: Please be advised that Rx refills may take up to 3 business days. We ask that you follow-up with your pharmacy.

## 2023-02-15 MED ORDER — AMPHETAMINE-DEXTROAMPHETAMINE 10 MG PO TABS: 10.0000 mg | ORAL_TABLET | Freq: Every day | ORAL | 0 refills | Status: DC

## 2023-02-15 MED ORDER — AMPHETAMINE-DEXTROAMPHET ER 30 MG PO CP24: 30.0000 mg | ORAL_CAPSULE | ORAL | 0 refills | Status: DC

## 2023-02-15 NOTE — Telephone Encounter (Signed)
Refills requested for Adderall 10mg  and 20mg  sent to [facility] [place]. Next OV 07/08/23

## 2023-02-20 ENCOUNTER — Other Ambulatory Visit: Payer: Self-pay | Admitting: Family Medicine

## 2023-02-20 NOTE — Telephone Encounter (Signed)
Copied from CRM [id number]. Topic: Clinical - Medication Refill >> Feb 20, 2023  4:06 PM Orinda Kenner C wrote: Most Recent Primary Care Visit:  Provider: Jeoffrey Massed  Department: [facility]  Visit Type: OFFICE VISIT  Date: 01/07/2023  Medication: amphetamine-dextroamphetamine (ADDERALL XR) 30 MG 24 hr capsule 1 tablet daily  amphetamine-dextroamphetamine (ADDERALL) 10 MG tablet 1 tablet daily   Has the patient contacted their pharmacy? Yes (Agent: If no, request that the patient contact the pharmacy for the refill. If patient does not wish to contact the pharmacy document the reason why and proceed with request.) (Agent: If yes, when and what did the pharmacy advise?)  Is this the correct pharmacy for this prescription? Yes If no, delete pharmacy and type the correct one.  This is the patient's preferred pharmacy:  [id number] - [place], [place] - [address] [place] [address] Phone: [phone] Fax: [phone]   Has the prescription been filled recently? No  Is the patient out of the medication? Yes  Has the patient been seen for an appointment in the last year OR does the patient have an upcoming appointment? Yes  Can we respond through MyChart? No. [facility], patient [facility] does not cover [facility]. Patient is out of medications and needs to have the office fixed this error today. Please contact patient at [phone]  Agent: Please be advised that Rx refills may take up to 3 business days. We ask that you follow-up with your pharmacy.

## 2023-02-22 ENCOUNTER — Other Ambulatory Visit: Payer: Self-pay | Admitting: Family Medicine

## 2023-02-22 MED ORDER — AMPHETAMINE-DEXTROAMPHETAMINE 10 MG PO TABS: 10.0000 mg | ORAL_TABLET | Freq: Every day | ORAL | 0 refills | Status: DC

## 2023-02-22 MED ORDER — AMPHETAMINE-DEXTROAMPHET ER 30 MG PO CP24: 30.0000 mg | ORAL_CAPSULE | ORAL | 0 refills | Status: DC

## 2023-03-11 ENCOUNTER — Encounter: Payer: Self-pay | Admitting: Urgent Care

## 2023-03-11 ENCOUNTER — Ambulatory Visit: Payer: 59 | Admitting: Urgent Care

## 2023-03-11 VITALS — BP 137/84 | HR 73 | Temp 98.0°F | Wt 203.4 lb

## 2023-03-11 DIAGNOSIS — R52 Pain, unspecified: Secondary | ICD-10-CM | POA: Diagnosis not present

## 2023-03-11 DIAGNOSIS — J0100 Acute maxillary sinusitis, unspecified: Secondary | ICD-10-CM | POA: Diagnosis not present

## 2023-03-11 DIAGNOSIS — F3170 Bipolar disorder, currently in remission, most recent episode unspecified: Secondary | ICD-10-CM | POA: Diagnosis not present

## 2023-03-11 DIAGNOSIS — R051 Acute cough: Secondary | ICD-10-CM | POA: Diagnosis not present

## 2023-03-11 LAB — POCT INFLUENZA A/B
Influenza A, POC: NEGATIVE
Influenza B, POC: NEGATIVE

## 2023-03-11 LAB — POC COVID19 BINAXNOW: SARS Coronavirus 2 Ag: NEGATIVE

## 2023-03-11 MED ORDER — LAMOTRIGINE 200 MG PO TABS
200.0000 mg | ORAL_TABLET | Freq: Every day | ORAL | 1 refills | Status: DC
Start: 2023-03-11 — End: 2023-08-28

## 2023-03-11 MED ORDER — IPRATROPIUM BROMIDE 0.03 % NA SOLN: 2.0000 | Freq: Two times a day (BID) | NASAL | 0 refills | Status: DC

## 2023-03-11 MED ORDER — AMOXICILLIN-POT CLAVULANATE 875-125 MG PO TABS: 1.0000 | ORAL_TABLET | Freq: Two times a day (BID) | ORAL | 0 refills | Status: DC

## 2023-03-11 MED ORDER — CETIRIZINE HCL 10 MG PO TABS: 10.0000 mg | ORAL_TABLET | Freq: Every day | ORAL | 0 refills | Status: AC

## 2023-03-11 NOTE — Progress Notes (Signed)
 Established Patient Office Visit  Subjective:  Patient ID: Wendy Hawkins, female    DOB: 02-16-1975  Age: 48 y.o. MRN: [id number]  Chief Complaint  Patient presents with   Cough    Cough, congestion, body aches since last Thursday. She has also had a fever. She has not tested for flu or Covid.    HPI   Discussed the use of AI scribe software for clinical note transcription with the patient, who gave verbal consent to proceed.  History of Present Illness   Wendy Hawkins is a 48 year old female who presents with flu-like symptoms including congestion, body aches, and ear pain.  She has been experiencing flu-like symptoms since Thursday, which have progressively worsened. Initially, she had a runny nose, but her symptoms escalated to include a heavy chest, difficulty breathing, ear pain, throat pain, and watery eyes. She feels achy and weak, with body aches starting on Saturday.  She reports a headache with pain localized to her sinuses and primarily affecting her right ear, which feels sore and occasionally experiences sharp pain when blowing her nose. She experiences a sensation of fever, feeling hot, and having hot and cold chills, although she did not have a fever at the time of the visit, possibly due to taking Advil a few hours prior.  She vomited once on Saturday, which she attributes to excessive coughing, but has otherwise been able to keep liquids down. No other gastrointestinal symptoms such as upset stomach, nausea, vomiting, or diarrhea are present, except for the single episode of vomiting.  She has been taking Advil and a cold congestion medication to manage her symptoms. She denies any history of seasonal allergies.  She uses an e-cigarette and has not been around anyone else who is sick.     Additionally, patient is requesting refill of her lamictal .  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 Ebony Goldstein   Atopic dermatitis    Attention deficit disorder (ADD) in adult    Bipolar disease, chronic (HCC)    BRBPR (bright red blood per rectum)    hemorrhoids   GERD (gastroesophageal reflux disease)    Melanoma (HCC)    ankle, calf, back of leg   PTSD (post-traumatic stress disorder)    Past Surgical History:  Procedure Laterality Date   TONSILLECTOMY AND ADENOIDECTOMY  2002   WISDOM TOOTH EXTRACTION     Social History   Tobacco Use   Smoking status: Former    Current packs/day: 0.00    Average packs/day: 1 pack/day for 20.0 years (20.0 ttl pk-yrs)    Types: Cigarettes    Start date: 06/04/1992    Quit date: 06/04/2012    Years since quitting: 10.7   Smokeless tobacco: Never   Tobacco comments:    e cigs periodically  Vaping Use   Vaping status: Every Day  Substance Use Topics   Alcohol use: Not Currently    Alcohol/week: 2.0 standard drinks of alcohol    Types: 2 Shots of liquor per week    Comment: socially   Drug use:  Yes    Types: Marijuana      ROS: as noted in HPI  Objective:     BP 137/84   Pulse 73   Temp 98 F (36.7 C) (Oral)   Wt 203 lb 6.4 oz (92.3 kg)   SpO2 97%   BMI 32.83 kg/m  BP Readings from Last 3 Encounters:  03/11/23 137/84  01/07/23 128/84  10/08/22 131/85   Wt Readings from Last 3 Encounters:  03/11/23 203 lb 6.4 oz (92.3 kg)  01/07/23 205 lb 9.6 oz (93.3 kg)  10/08/22 204 lb 6.4 oz (92.7 kg)      Physical Exam Vitals and nursing note reviewed.  Constitutional:      General: She is not in acute distress.    Appearance: Normal appearance. She is normal weight. She is  ill-appearing. She is not toxic-appearing or diaphoretic.  HENT:     Head: Normocephalic and atraumatic.     Right Ear: Ear canal and external ear normal. No drainage, swelling or tenderness. A middle ear effusion is present. There is no impacted cerumen. Tympanic membrane is not injected, scarred, perforated or erythematous.     Left Ear: Ear canal and external ear normal. No drainage, swelling or tenderness. A middle ear effusion is present. There is no impacted cerumen. Tympanic membrane is not injected, scarred, perforated or erythematous.     Nose: Congestion and rhinorrhea present. Rhinorrhea is purulent.     Right Turbinates: Enlarged and swollen.     Left Turbinates: Enlarged and swollen.     Right Sinus: Maxillary sinus tenderness and frontal sinus tenderness present.     Left Sinus: Maxillary sinus tenderness and frontal sinus tenderness present.     Mouth/Throat:     Lips: Pink.     Mouth: Mucous membranes are moist.     Pharynx: Oropharynx is clear. Uvula midline. Postnasal drip present. No pharyngeal swelling, oropharyngeal exudate, posterior oropharyngeal erythema or uvula swelling.  Cardiovascular:     Rate and Rhythm: Normal rate and regular rhythm.  Pulmonary:     Effort: Pulmonary effort is normal. No respiratory distress.     Breath sounds: Normal breath sounds. No stridor. No wheezing, rhonchi or rales.  Musculoskeletal:     Cervical back: Normal range of motion and neck supple. No rigidity or tenderness.  Lymphadenopathy:     Cervical: No cervical adenopathy.  Skin:    General: Skin is warm and dry.     Coloration: Skin is not jaundiced.     Findings: No bruising, erythema or rash.  Neurological:     General: No focal deficit present.     Mental Status: She is alert and oriented to person, place, and time.     Motor: No weakness.     Gait: Gait normal.      Results for orders placed or performed in visit on 03/11/23  POCT Influenza A/B  Result Value Ref  Range   Influenza A, POC Negative Negative   Influenza B, POC Negative Negative  POC COVID-19 BinaxNow  Result Value Ref Range   SARS Coronavirus 2 Ag Negative Negative    Last CBC Lab Results  Component Value Date   WBC 6.5 11/09/2021   HGB 13.7 11/09/2021   HCT 40.6 11/09/2021   MCV 91.3 11/09/2021   MCH 30.1 02/20/2019   RDW 13.3 11/09/2021   PLT 239.0 11/09/2021   Last metabolic panel Lab Results  Component Value Date   GLUCOSE 85 11/09/2021   NA 140  11/09/2021   K 4.2 11/09/2021   CL 103 11/09/2021   CO2 29 11/09/2021   BUN 8 11/09/2021   CREATININE 0.69 11/09/2021   GFR 105.02 11/09/2021   CALCIUM 9.3 11/09/2021   PROT 7.0 11/09/2021   ALBUMIN 4.6 11/09/2021   BILITOT 1.3 (H) 11/09/2021   ALKPHOS 55 11/09/2021   AST 11 11/09/2021   ALT 9 11/09/2021      The 10-year ASCVD risk score (Arnett DK, et al., 2019) is: 0.9%  Assessment & Plan:  Acute cough -     POCT Influenza A/B -     Cetirizine  HCl; Take 1 tablet (10 mg total) by mouth daily.  Dispense: 30 tablet; Refill: 0  Body aches -     POCT Influenza A/B -     POC COVID-19 BinaxNow  Acute non-recurrent maxillary sinusitis -     Amoxicillin -Pot Clavulanate; Take 1 tablet by mouth 2 (two) times daily.  Dispense: 20 tablet; Refill: 0 -     Ipratropium Bromide ; Place 2 sprays into both nostrils every 12 (twelve) hours.  Dispense: 30 mL; Refill: 0 -     Cetirizine  HCl; Take 1 tablet (10 mg total) by mouth daily.  Dispense: 30 tablet; Refill: 0  Bipolar disorder in full remission, most recent episode unspecified type (HCC) -     lamoTRIgine ; Take 1 tablet (200 mg total) by mouth daily.  Dispense: 90 tablet; Refill: 1  Assessment and Plan    Upper Respiratory Infection/Sinusitis   Symptoms of congestion, sore throat, ear pain, and body aches started on Thursday, with body aches intensifying on Saturday. Feverish with hot and cold chills. Sinus tenderness on examination and fluid in ears, particularly the  right one.   -Start treatment for sinusitis (agumentin) which will also cover for potential pneumonia.   -zyrtec  -atrovent  nasal as pt allergic to flonase   Influenza/COVID-19   Testing in progress. If positive for flu, given the time frame, will let symptoms run their course. If positive for COVID-19, will discuss further.   -negative tests in office   Lamictal  Refill   Patient needs a refill of Lamictal  200mg  daily.   -Refill Lamictal  prescription.    General Health Maintenance   Patient needs to schedule a gynecology appointment for Pap smear.   -Advise patient to schedule gynecology appointment.         No follow-ups on file.   Mandy Second, PA

## 2023-03-11 NOTE — Patient Instructions (Addendum)
 You have a sinus infection. Your flu and covid swab are negative.  Please start taking the antibiotic, Augmentin , twice daily with food. Take it for all 10 days, do not stop early just because you feel better. Take an over the counter probiotic or yogurt daily to help prevent diarrhea/ yeast infection.  Start taking zyrtec  once daily to help clear up the mucous. Use Atrovent  nasal spray daily to help with inflammation of the nasal passage.  It is also recommended that you use nasal saline/ sinus washes to cleans the sinus passages. Hot steam from a shower or vaporizer may also be beneficial to help open up the upper airway. Eucalyptus can be helpful.  OTC oscillococcinum helps with body aches OTC Quercetin 500mg  with zinc 50mg  can help with inflammation and boost your natural immunity.   Please schedule pap smear appointment with your gynecologist and have results faxed to our office Keep your appointment with Dr. MvGowen in June, return sooner as needed

## 2023-03-27 ENCOUNTER — Other Ambulatory Visit: Payer: Self-pay | Admitting: Family Medicine

## 2023-03-27 NOTE — Telephone Encounter (Signed)
 Copied from CRM [id number]. Topic: Clinical - Medication Refill >> Mar 27, 2023  9:21 AM Lorin Glass B wrote: Most Recent Primary Care Visit:  Provider: Maretta Bees  Department: [facility]  Visit Type: ACUTE  Date: 03/11/2023  Medication: amphetamine-dextroamphetamine (ADDERALL XR) 30 MG 24 hr capsule amphetamine-dextroamphetamine (ADDERALL) 10 MG tablet  Has the patient contacted their pharmacy? Yes, no refills  (Agent: If no, request that the patient contact the pharmacy for the refill. If patient does not wish to contact the pharmacy document the reason why and proceed with request.) (Agent: If yes, when and what did the pharmacy advise?)  Is this the correct pharmacy for this prescription? Yes If no, delete pharmacy and type the correct one.  This is the patient's preferred pharmacy:   [facility] #[id number] - [place], [place] - [address] [place] [address] Phone: [phone] Fax: [phone]   Has the prescription been filled recently? Yes  Is the patient out of the medication? No, 2 of each left  Has the patient been seen for an appointment in the last year OR does the patient have an upcoming appointment? Yes  Can we respond through MyChart? Yes  Agent: Please be advised that Rx refills may take up to 3 business days. We ask that you follow-up with your pharmacy.

## 2023-03-28 MED ORDER — AMPHETAMINE-DEXTROAMPHETAMINE 10 MG PO TABS: 10.0000 mg | ORAL_TABLET | Freq: Every day | ORAL | 0 refills | Status: DC

## 2023-03-28 MED ORDER — AMPHETAMINE-DEXTROAMPHET ER 30 MG PO CP24: 30.0000 mg | ORAL_CAPSULE | ORAL | 0 refills | Status: DC

## 2023-03-28 NOTE — Telephone Encounter (Signed)
 Requesting: adderall 30mg  capsule Contract: 02/21/22 UDS: 02/26/22 Last Visit: 03/11/23 Next Visit: 07/08/23 Last Refill: 02/22/23 (30,0)  Requesting: adderall 10mg  tablet Contract: 02/21/22 UDS: 02/26/22 Last Visit: 03/11/23 Next Visit: 07/08/23 Last Refill: 02/22/23 (30,0)  Please Advise. Meds pending

## 2023-04-03 LAB — HM PAP SMEAR

## 2023-04-03 LAB — HM MAMMOGRAPHY

## 2023-04-03 LAB — RESULTS CONSOLE HPV: CHL HPV: NEGATIVE

## 2023-05-02 ENCOUNTER — Other Ambulatory Visit: Payer: Self-pay | Admitting: Family Medicine

## 2023-05-02 NOTE — Telephone Encounter (Signed)
 Last Fill: 03/28/23 x2 scripts/ 30 tabs/0 RF  Last OV: 01/07/23 Next OV: 07/08/23  Routing to provider for review/authorization.

## 2023-05-02 NOTE — Telephone Encounter (Signed)
 Copied from CRM [id number]. Topic: Clinical - Medication Refill >> May 02, 2023  2:41 PM Desma Mcgregor wrote: Most Recent Primary Care Visit:  Provider: Maretta Bees  Department: [facility]  Visit Type: ACUTE  Date: 03/11/2023  Medication:  amphetamine-dextroamphetamine (ADDERALL XR) 30 MG 24 hr capsule amphetamine-dextroamphetamine (ADDERALL) 10 MG tablet  Has the patient contacted their pharmacy? Yes, Patient had to call and request refills.  Is this the correct pharmacy for this prescription? Yes If no, delete pharmacy and type the correct one.  This is the patient's preferred pharmacy:  [facility] #[id number] - [place],  - [address] [place] [address] Phone: [phone] Fax: [phone]   Has the prescription been filled recently? Yes  Is the patient out of the medication? No, but has about 3 days worth left of each  Has the patient been seen for an appointment in the last year OR does the patient have an upcoming appointment? Yes  Can we respond through MyChart? Yes  Agent: Please be advised that Rx refills may take up to 3 business days. We ask that you follow-up with your pharmacy.

## 2023-05-03 MED ORDER — AMPHETAMINE-DEXTROAMPHETAMINE 10 MG PO TABS: 10.0000 mg | ORAL_TABLET | Freq: Every day | ORAL | 0 refills | Status: DC

## 2023-05-03 MED ORDER — AMPHETAMINE-DEXTROAMPHET ER 30 MG PO CP24: 30.0000 mg | ORAL_CAPSULE | ORAL | 0 refills | Status: DC

## 2023-06-20 ENCOUNTER — Encounter: Payer: Self-pay | Admitting: Family Medicine

## 2023-06-20 MED ORDER — AMPHETAMINE-DEXTROAMPHET ER 30 MG PO CP24: 30.0000 mg | ORAL_CAPSULE | ORAL | 0 refills | Status: DC

## 2023-06-20 MED ORDER — AMPHETAMINE-DEXTROAMPHETAMINE 10 MG PO TABS: 10.0000 mg | ORAL_TABLET | Freq: Every day | ORAL | 0 refills | Status: DC

## 2023-06-20 NOTE — Telephone Encounter (Signed)
 Pt advised.

## 2023-06-20 NOTE — Telephone Encounter (Signed)
Okay, prescriptions sent

## 2023-07-08 ENCOUNTER — Ambulatory Visit: Payer: 59 | Admitting: Family Medicine

## 2023-07-08 DIAGNOSIS — J0100 Acute maxillary sinusitis, unspecified: Secondary | ICD-10-CM

## 2023-07-10 LAB — LAB REPORT - SCANNED: TSH: 1.38 (ref 0.41–5.90)

## 2023-07-19 ENCOUNTER — Encounter: Payer: Self-pay | Admitting: Family Medicine

## 2023-07-19 MED ORDER — AMPHETAMINE-DEXTROAMPHET ER 30 MG PO CP24: 30.0000 mg | ORAL_CAPSULE | ORAL | 0 refills | Status: DC

## 2023-07-19 MED ORDER — AMPHETAMINE-DEXTROAMPHETAMINE 10 MG PO TABS: 10.0000 mg | ORAL_TABLET | Freq: Every day | ORAL | 0 refills | Status: DC

## 2023-07-19 NOTE — Telephone Encounter (Signed)
 Rxs sent

## 2023-07-22 ENCOUNTER — Ambulatory Visit: Admitting: Family Medicine

## 2023-07-22 DIAGNOSIS — F988 Other specified behavioral and emotional disorders with onset usually occurring in childhood and adolescence: Secondary | ICD-10-CM

## 2023-07-22 DIAGNOSIS — Z79899 Other long term (current) drug therapy: Secondary | ICD-10-CM

## 2023-07-22 NOTE — Progress Notes (Deleted)
 OFFICE VISIT  07/22/2023  CC: No chief complaint on file.   Patient is a 48 y.o. female who presents for annual health maintenance exam and 56-month follow-up adult ADD, bipolar disorder, and GAD. A/P as of last visit: Bipolar depression, GAD, and adult ADD. Doing well long-term on Adderall XR 30 mg every morning as well as Adderall IR 10 mg once daily. Also continue Lamictal  200 mg a day. She will use alprazolam  0.5 mg, 1 twice daily as needed.  A new prescription was not needed for this medication today.  INTERIM HX: ***   PMP AWARE reviewed today: most recent rx for Adderall XR 30 mg was filled 07/20/2023, # 30, rx by me.  Most recent prescription for Adderall IR 10 mg was 07/19/2023, #30, prescription by me.  Most recent alprazolam  prescription was filled 12/19/2022, #30, prescription by me. No red flags.   Past Medical History:  Diagnosis Date   Anxiety and depression    Dr. Greig Mages   Atopic dermatitis    Attention deficit disorder (ADD) in adult    Bipolar disease, chronic (HCC)    BRBPR (bright red blood per rectum)    hemorrhoids   GERD (gastroesophageal reflux disease)    Melanoma (HCC)    ankle, calf, back of leg   PTSD (post-traumatic stress disorder)     Past Surgical History:  Procedure Laterality Date   TONSILLECTOMY AND ADENOIDECTOMY  2002   WISDOM TOOTH EXTRACTION     Family History  Problem Relation Age of Onset   Lung cancer Father    Hypertension Father    Heart disease Father    Skin cancer Father    Arthritis Maternal Grandmother    Hearing loss Maternal Grandmother    Arthritis Paternal Grandmother    Arthritis Paternal Grandfather    Cancer Paternal Grandfather    COPD Paternal Grandfather    Early death Paternal Grandfather    Hearing loss Paternal Grandfather    Hypercholesterolemia Paternal Grandfather    Hypertension Paternal Grandfather    Alcohol abuse Neg Hx    Diabetes Neg Hx    Drug abuse Neg Hx    Hyperlipidemia Neg Hx     Kidney disease Neg Hx    Stroke Neg Hx    Social History   Socioeconomic History   Marital status: Married    Spouse name: Not on file   Number of children: 3   Years of education: Not on file   Highest education level: Not on file  Occupational History   Occupation: Landscape architect: [employer]    Comment: [employer]  Tobacco Use   Smoking status: Former    Current packs/day: 0.00    Average packs/day: 1 pack/day for 20.0 years (20.0 ttl pk-yrs)    Types: Cigarettes    Start date: 06/04/1992    Quit date: 06/04/2012    Years since quitting: 11.1   Smokeless tobacco: Never   Tobacco comments:    e cigs periodically  Vaping Use   Vaping status: Every Day  Substance and Sexual Activity   Alcohol use: Not Currently    Alcohol/week: 2.0 standard drinks of alcohol    Types: 2 Shots of liquor per week    Comment: socially   Drug use: Yes    Types: Marijuana   Sexual activity: Yes    Partners: Male    Birth control/protection: I.U.D.    Comment: married  Other Topics Concern   Not on file  Social History Narrative   Married, 2 kids.   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   E-cigs   Social Drivers of Corporate investment banker Strain: Not on file  Food Insecurity: Not on file  Transportation Needs: Not on file  Physical Activity: Not on file  Stress: Not on file  Social Connections: Not on file    Outpatient Medications Prior to Visit  Medication Sig Dispense Refill   ALPRAZolam  (XANAX ) 0.5 MG tablet Take 1 tablet (0.5 mg total) by mouth at bedtime. 30 tablet 2   amphetamine -dextroamphetamine  (ADDERALL XR) 30 MG 24 hr capsule Take 1 capsule (30 mg total) by mouth every morning. 30 capsule 0   amphetamine -dextroamphetamine  (ADDERALL) 10 MG tablet Take 1 tablet (10 mg total) by mouth daily with breakfast. 30 tablet 0   cetirizine  (ZYRTEC ) 10 MG tablet Take 1 tablet (10 mg total) by mouth daily. 30 tablet 0   lamoTRIgine   (LAMICTAL ) 200 MG tablet Take 1 tablet (200 mg total) by mouth daily. 90 tablet 1   levonorgestrel (MIRENA) 20 MCG/24HR IUD 1 Intra Uterine Device (1 each total) by Intrauterine route once. 1 each 0   No facility-administered medications prior to visit.    Allergies  Allergen Reactions   Codeine Nausea And Vomiting and Other (See Comments)   Corticosteroids Other (See Comments)   Cyclobenzaprine  Other (See Comments)   Depo-Medrol [Methylprednisolone Sodium Succ] Hives    Review of Systems ***  PE:    03/11/2023   10:22 AM 01/07/2023    3:41 PM 10/08/2022    3:44 PM  Vitals with BMI  Height   5' 6  Weight 203 lbs 6 oz 205 lbs 10 oz 204 lbs 6 oz  BMI   33.01  Systolic 137 128 868  Diastolic 84 84 85  Pulse 73 79 87    Physical Exam  ***  LABS:  Last CBC Lab Results  Component Value Date   WBC 6.5 11/09/2021   HGB 13.7 11/09/2021   HCT 40.6 11/09/2021   MCV 91.3 11/09/2021   MCH 30.1 02/20/2019   RDW 13.3 11/09/2021   PLT 239.0 11/09/2021   Last metabolic panel Lab Results  Component Value Date   GLUCOSE 85 11/09/2021   NA 140 11/09/2021   K 4.2 11/09/2021   CL 103 11/09/2021   CO2 29 11/09/2021   BUN 8 11/09/2021   CREATININE 0.69 11/09/2021   GFR 105.02 11/09/2021   CALCIUM 9.3 11/09/2021   PROT 7.0 11/09/2021   ALBUMIN 4.6 11/09/2021   BILITOT 1.3 (H) 11/09/2021   ALKPHOS 55 11/09/2021   AST 11 11/09/2021   ALT 9 11/09/2021   Last lipids Lab Results  Component Value Date   CHOL 166 11/09/2021   HDL 49.90 11/09/2021   LDLCALC 97 11/09/2021   TRIG 94.0 11/09/2021   CHOLHDL 3 11/09/2021   Last hemoglobin A1c Lab Results  Component Value Date   HGBA1C 5.4 09/12/2020   Last thyroid  functions Lab Results  Component Value Date   TSH 1.69 11/09/2021   T3TOTAL 139 01/27/2018   Last vitamin D  Lab Results  Component Value Date   VD25OH 41.70 09/12/2020   IMPRESSION AND PLAN:  No problem-specific Assessment & Plan notes found for this  encounter.  1) Health maintenance exam: Reviewed age and gender appropriate health maintenance issues (prudent diet, regular exercise, health risks of tobacco and excessive alcohol, use of seatbelts, fire alarms in home, use  of sunscreen).  Also reviewed age and gender appropriate health screening as well as vaccine recommendations. Vaccines: UTD Labs: HP labs ordered today Cervical ca screening: GYN-Dr. Curlene Breast ca screening: overdue for annual screening mammogram-->ordered Sept 2024. Colon ca screening: due for initial screening-> options discussed today-->***.  An After Visit Summary was printed and given to the patient.  FOLLOW UP: No follow-ups on file.  Signed:  Gerlene Hockey, MD           07/22/2023

## 2023-08-21 ENCOUNTER — Other Ambulatory Visit: Payer: Self-pay

## 2023-08-21 DIAGNOSIS — F3170 Bipolar disorder, currently in remission, most recent episode unspecified: Secondary | ICD-10-CM

## 2023-08-26 ENCOUNTER — Telehealth: Payer: Self-pay | Admitting: Family Medicine

## 2023-08-26 DIAGNOSIS — F3170 Bipolar disorder, currently in remission, most recent episode unspecified: Secondary | ICD-10-CM

## 2023-08-26 NOTE — Telephone Encounter (Signed)
 Copied from CRM [id number]. Topic: Clinical - Medication Refill >> Aug 26, 2023  3:49 PM Rosina BIRCH wrote: Medication: lamoTRIgine  (LAMICTAL ) 200 MG tablet  Has the patient contacted their pharmacy? No (Agent: If no, request that the patient contact the pharmacy for the refill. If patient does not wish to contact the pharmacy document the reason why and proceed with request.) (Agent: If yes, when and what did the pharmacy advise?)  This is the patient's preferred pharmacy:  [id number] - [place], [place] - [address] [place] [address] Phone: [phone] Fax: [phone]  Is this the correct pharmacy for this prescription? Yes If no, delete pharmacy and type the correct one.   Has the prescription been filled recently?   Is the patient out of the medication?   Has the patient been seen for an appointment in the last year OR does the patient have an upcoming appointment? Yes  Can we respond through MyChart? Yes  Agent: Please be advised that Rx refills may take up to 3 business days. We ask that you follow-up with your pharmacy.

## 2023-08-28 ENCOUNTER — Encounter: Payer: Self-pay | Admitting: Family Medicine

## 2023-08-28 DIAGNOSIS — F3170 Bipolar disorder, currently in remission, most recent episode unspecified: Secondary | ICD-10-CM

## 2023-08-28 MED ORDER — LAMOTRIGINE 200 MG PO TABS: 200.0000 mg | ORAL_TABLET | Freq: Every day | ORAL | 3 refills | Status: AC

## 2023-08-28 MED ORDER — AMPHETAMINE-DEXTROAMPHETAMINE 10 MG PO TABS: 10.0000 mg | ORAL_TABLET | Freq: Every day | ORAL | 0 refills | Status: DC

## 2023-08-28 MED ORDER — AMPHETAMINE-DEXTROAMPHET ER 30 MG PO CP24: 30.0000 mg | ORAL_CAPSULE | ORAL | 0 refills | Status: DC

## 2023-08-28 NOTE — Telephone Encounter (Signed)
 Prescriptions sent. Needs follow-up before any further refills.

## 2023-08-29 NOTE — Telephone Encounter (Signed)
 Spoke with patient and scheduled follow up for 8/27

## 2023-09-25 ENCOUNTER — Ambulatory Visit (INDEPENDENT_AMBULATORY_CARE_PROVIDER_SITE_OTHER): Admitting: Family Medicine

## 2023-09-25 ENCOUNTER — Encounter: Payer: Self-pay | Admitting: Family Medicine

## 2023-09-25 VITALS — BP 128/84 | HR 79 | Temp 98.1°F | Ht 66.0 in | Wt 194.6 lb

## 2023-09-25 DIAGNOSIS — F411 Generalized anxiety disorder: Secondary | ICD-10-CM | POA: Diagnosis not present

## 2023-09-25 DIAGNOSIS — Z0000 Encounter for general adult medical examination without abnormal findings: Secondary | ICD-10-CM

## 2023-09-25 DIAGNOSIS — F988 Other specified behavioral and emotional disorders with onset usually occurring in childhood and adolescence: Secondary | ICD-10-CM | POA: Diagnosis not present

## 2023-09-25 DIAGNOSIS — Z79899 Other long term (current) drug therapy: Secondary | ICD-10-CM | POA: Diagnosis not present

## 2023-09-25 DIAGNOSIS — F3170 Bipolar disorder, currently in remission, most recent episode unspecified: Secondary | ICD-10-CM | POA: Diagnosis not present

## 2023-09-25 MED ORDER — AMPHETAMINE-DEXTROAMPHET ER 10 MG PO CP24
10.0000 mg | ORAL_CAPSULE | Freq: Every day | ORAL | 0 refills | Status: DC
Start: 2023-09-25 — End: 2023-11-06

## 2023-09-25 MED ORDER — AMPHETAMINE-DEXTROAMPHET ER 30 MG PO CP24: 30.0000 mg | ORAL_CAPSULE | ORAL | 0 refills | Status: DC

## 2023-09-25 MED ORDER — AMPHETAMINE-DEXTROAMPHETAMINE 10 MG PO TABS: 10.0000 mg | ORAL_TABLET | Freq: Every day | ORAL | 0 refills | Status: DC

## 2023-09-25 NOTE — Progress Notes (Signed)
 Office Note 09/25/2023  CC:  Chief Complaint  Patient presents with   Medical Management of Chronic Issues    HPI:  Patient is a 48 y.o. female who is here for annual health maintenance exam and follow-up mood disorder, anxiety disorder, adult ADD. Mood and anxiety: Pretty stable, some irritability for the most part.  She is very frustrated with her inability to lose weight.  She eats low calorie diet and does workouts with a trainer regularly.  ADD : she feels like she is getting less efficacy from the Adderall then she used to, feels more scatterbrained and overwhelmed easily, also feels like it did not last very long. She takes 30 mg XR around 6 in the morning and 10 mg immediate release/short acting around 11 AM. PMP AWARE reviewed today: most recent rx for Adderall XR 30 mg was filled 08/30/2023, # 30, rx by me.  Most recent prescription for Adderall 10 mg was filled 08/28/2023, #30, prescription by me. A prescription for phentermine 37.5 mg was filled 08/25/2023, #30, prescription by Darice Ellen. No red flags.  She is seeing her gynecologist regularly. She is going through menopause.  She has hot flashes, sleep dysfunction, and hair loss. She was put on testosterone injections weekly for a few weeks but says she stopped it because she did not feel any different. She also prescribed her phentermine but patient states it did not change anything. Jude reiterates today that she does not need an appetite suppressant.  She already eats very low calories. She has follow-up with her gynecologist next week.  Past Medical History:  Diagnosis Date   Anxiety and depression    Dr. Greig Mages   Atopic dermatitis    Attention deficit disorder (ADD) in adult    Bipolar disease, chronic (HCC)    BRBPR (bright red blood per rectum)    hemorrhoids   GERD (gastroesophageal reflux disease)    Melanoma (HCC)    ankle, calf, back of leg   PTSD (post-traumatic stress disorder)     Past  Surgical History:  Procedure Laterality Date   TONSILLECTOMY AND ADENOIDECTOMY  2002   WISDOM TOOTH EXTRACTION      Family History  Problem Relation Age of Onset   Lung cancer Father    Hypertension Father    Heart disease Father    Skin cancer Father    Arthritis Maternal Grandmother    Hearing loss Maternal Grandmother    Arthritis Paternal Grandmother    Arthritis Paternal Grandfather    Cancer Paternal Grandfather    COPD Paternal Grandfather    Early death Paternal Grandfather    Hearing loss Paternal Grandfather    Hypercholesterolemia Paternal Grandfather    Hypertension Paternal Grandfather    Alcohol abuse Neg Hx    Diabetes Neg Hx    Drug abuse Neg Hx    Hyperlipidemia Neg Hx    Kidney disease Neg Hx    Stroke Neg Hx     Social History   Socioeconomic History   Marital status: Married    Spouse name: Not on file   Number of children: 3   Years of education: Not on file   Highest education level: Not on file  Occupational History   Occupation: Landscape architect: [employer]    Comment: [employer]  Tobacco Use   Smoking status: Former    Current packs/day: 0.00    Average packs/day: 1 pack/day for 20.0 years (20.0 ttl pk-yrs)  Types: Cigarettes    Start date: 06/04/1992    Quit date: 06/04/2012    Years since quitting: 11.3   Smokeless tobacco: Never   Tobacco comments:    e cigs periodically  Vaping Use   Vaping status: Every Day  Substance and Sexual Activity   Alcohol use: Not Currently    Alcohol/week: 2.0 standard drinks of alcohol    Types: 2 Shots of liquor per week    Comment: socially   Drug use: Yes    Types: Marijuana   Sexual activity: Yes    Partners: Male    Birth control/protection: I.U.D.    Comment: married  Other Topics Concern   Not on file  Social History Narrative   Married, 2 kids.   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   E-cigs   Social Drivers of Corporate investment banker  Strain: Not on file  Food Insecurity: Not on file  Transportation Needs: Not on file  Physical Activity: Not on file  Stress: Not on file  Social Connections: Not on file  Intimate Partner Violence: Not on file    Outpatient Medications Prior to Visit  Medication Sig Dispense Refill   ALPRAZolam  (XANAX ) 0.5 MG tablet Take 1 tablet (0.5 mg total) by mouth at bedtime. 30 tablet 2   cetirizine  (ZYRTEC ) 10 MG tablet Take 1 tablet (10 mg total) by mouth daily. 30 tablet 0   lamoTRIgine  (LAMICTAL ) 200 MG tablet Take 1 tablet (200 mg total) by mouth daily. 90 tablet 3   levonorgestrel (MIRENA) 20 MCG/24HR IUD 1 Intra Uterine Device (1 each total) by Intrauterine route once. 1 each 0   phentermine (ADIPEX-P) 37.5 MG tablet Take 37.5 mg by mouth daily.     amphetamine -dextroamphetamine  (ADDERALL XR) 30 MG 24 hr capsule Take 1 capsule (30 mg total) by mouth every morning. 30 capsule 0   amphetamine -dextroamphetamine  (ADDERALL) 10 MG tablet Take 1 tablet (10 mg total) by mouth daily with breakfast. 30 tablet 0   No facility-administered medications prior to visit.    Allergies  Allergen Reactions   Codeine Nausea And Vomiting and Other (See Comments)   Corticosteroids Other (See Comments)   Cyclobenzaprine  Other (See Comments)   Depo-Medrol [Methylprednisolone Sodium Succ] Hives    Review of Systems  Constitutional:  Positive for fatigue. Negative for appetite change, chills and fever.  HENT:  Negative for congestion, dental problem, ear pain and sore throat.   Eyes:  Negative for discharge, redness and visual disturbance.  Respiratory:  Negative for cough, chest tightness, shortness of breath and wheezing.   Cardiovascular:  Positive for leg swelling (chronic feeling of swollen legs). Negative for chest pain and palpitations.  Gastrointestinal:  Negative for abdominal pain, blood in stool, diarrhea, nausea and vomiting.  Genitourinary:  Negative for difficulty urinating, dysuria, flank  pain, frequency, hematuria and urgency.  Musculoskeletal:  Negative for arthralgias, back pain, joint swelling, myalgias and neck stiffness.  Skin:  Negative for pallor and rash.  Neurological:  Negative for dizziness, speech difficulty, weakness and headaches.  Hematological:  Negative for adenopathy. Does not bruise/bleed easily.  Psychiatric/Behavioral:  Positive for decreased concentration. Negative for confusion and sleep disturbance. The patient is nervous/anxious.     PE;    09/25/2023    3:46 PM 03/11/2023   10:22 AM 01/07/2023    3:41 PM  Vitals with BMI  Height 5' 6    Weight 194 lbs 10 oz 203 lbs  6 oz 205 lbs 10 oz  BMI 31.42    Systolic 128 137 871  Diastolic 84 84 84  Pulse 79 73 79   Gen: Alert, well appearing.  Patient is oriented to person, place, time, and situation. AFFECT: pleasant, lucid thought and speech. ENT: Ears: EACs clear, normal epithelium.  TMs with good light reflex and landmarks bilaterally.  Eyes: no injection, icteris, swelling, or exudate.  EOMI, PERRLA. Nose: no drainage or turbinate edema/swelling.  No injection or focal lesion.  Mouth: lips without lesion/swelling.  Oral mucosa pink and moist.  Dentition intact and without obvious caries or gingival swelling.  Oropharynx without erythema, exudate, or swelling.  Neck: supple/nontender.  No LAD, mass, or TM.  Carotid pulses 2+ bilaterally, without bruits. CV: RRR, no m/r/g.   LUNGS: CTA bilat, nonlabored resps, good aeration in all lung fields. ABD: soft, NT, ND, BS normal.  No hepatospenomegaly or mass.  No bruits. EXT: no clubbing, cyanosis, or edema.  Musculoskeletal: no joint swelling, erythema, warmth, or tenderness.  ROM of all joints intact. Skin - no sores or suspicious lesions or rashes or color changes  Pertinent labs:  Lab Results  Component Value Date   TSH 1.69 11/09/2021   Lab Results  Component Value Date   WBC 6.5 11/09/2021   HGB 13.7 11/09/2021   HCT 40.6 11/09/2021   MCV  91.3 11/09/2021   PLT 239.0 11/09/2021   Lab Results  Component Value Date   CREATININE 0.69 11/09/2021   BUN 8 11/09/2021   NA 140 11/09/2021   K 4.2 11/09/2021   CL 103 11/09/2021   CO2 29 11/09/2021   Lab Results  Component Value Date   ALT 9 11/09/2021   AST 11 11/09/2021   ALKPHOS 55 11/09/2021   BILITOT 1.3 (H) 11/09/2021   Lab Results  Component Value Date   CHOL 166 11/09/2021   Lab Results  Component Value Date   HDL 49.90 11/09/2021   Lab Results  Component Value Date   LDLCALC 97 11/09/2021   Lab Results  Component Value Date   TRIG 94.0 11/09/2021   Lab Results  Component Value Date   CHOLHDL 3 11/09/2021   Lab Results  Component Value Date   HGBA1C 5.4 09/12/2020   ASSESSMENT AND PLAN:   1) Health maintenance exam: Reviewed age and gender appropriate health maintenance issues (prudent diet, regular exercise, health risks of tobacco and excessive alcohol, use of seatbelts, fire alarms in home, use of sunscreen).  Also reviewed age and gender appropriate health screening as well as vaccine recommendations. Vaccines: UTD Labs: She has had lab panels recently done at her gynecologist.  Will attempt to get these records. Cervical ca screening: [facility], next pelvic planned for January 2026.. Breast ca screening: She gets annual mammograms through her gynecologist office, next one planned for January 2026.   Colon ca screening: due for initial screening-> options discussed--> at this point she is certain she will be unable to do a colonoscopy.  She says she has a severe gag reflex and will not be able to take the prep.    #2 adult ADD. Increase Adderall XR to 30 mg every morning +10 mg every morning.  Continue 10 mg IR/short acting around midday.  Controlled substance contract updated. Urine drug screen today.  3.  Bipolar disorder and GAD. Doing well long-term on Lamictal  200 mg a day.  She has alprazolam  0.5 mg to take nightly as  needed.  4.  Menopausal syndrome, inability to lose weight. This frustrates her the most. Ozempic in the past plus recent phentermine--> no help. She is following up with her GYN MD about this.  An After Visit Summary was printed and given to the patient.  FOLLOW UP:  Return in about 6 months (around 03/27/2024) for routine chronic illness f/u.  Signed:  Gerlene Hockey, MD           09/25/2023

## 2023-09-27 LAB — DRUG MONITOR, PANEL 1, SCREEN, URINE
Amphetamines: POSITIVE ng/mL — AB (ref <500)
Barbiturates: NEGATIVE ng/mL (ref <300)
Benzodiazepines: NEGATIVE ng/mL (ref <100)
Cocaine Metabolite: NEGATIVE ng/mL (ref <150)
Creatinine: 225.1 mg/dL (ref >=20.0)
Marijuana Metabolite: POSITIVE ng/mL — AB (ref <20)
Methadone Metabolite: NEGATIVE ng/mL (ref <100)
Opiates: NEGATIVE ng/mL (ref <100)
Oxidant: NEGATIVE ug/mL (ref <200)
Oxycodone: NEGATIVE ng/mL (ref <100)
Phencyclidine: NEGATIVE ng/mL (ref <25)
pH: 6.1 (ref 4.5–9.0)

## 2023-09-27 LAB — DM TEMPLATE

## 2023-09-28 LAB — DRUG MONITORING PANEL 376104, URINE
Amphetamine: >15000 ng/mL — ABNORMAL HIGH (ref <250)
Amphetamines: POSITIVE ng/mL — AB (ref <500)
Barbiturates: NEGATIVE ng/mL (ref <300)
Benzodiazepines: NEGATIVE ng/mL (ref <100)
Cocaine Metabolite: NEGATIVE ng/mL (ref <150)
Desmethyltramadol: NEGATIVE ng/mL (ref <100)
Methamphetamine: NEGATIVE ng/mL (ref <250)
Opiates: NEGATIVE ng/mL (ref <100)
Oxycodone: NEGATIVE ng/mL (ref <100)
Tramadol: NEGATIVE ng/mL (ref <100)

## 2023-09-28 LAB — DM TEMPLATE

## 2023-11-05 ENCOUNTER — Encounter: Payer: Self-pay | Admitting: Family Medicine

## 2023-11-06 MED ORDER — AMPHETAMINE-DEXTROAMPHET ER 10 MG PO CP24: 10.0000 mg | ORAL_CAPSULE | Freq: Every day | ORAL | 0 refills | Status: DC

## 2023-11-06 MED ORDER — AMPHETAMINE-DEXTROAMPHETAMINE 10 MG PO TABS: 10.0000 mg | ORAL_TABLET | Freq: Every day | ORAL | 0 refills | Status: DC

## 2023-11-06 MED ORDER — AMPHETAMINE-DEXTROAMPHET ER 30 MG PO CP24: 30.0000 mg | ORAL_CAPSULE | ORAL | 0 refills | Status: DC

## 2023-11-06 NOTE — Telephone Encounter (Signed)
 Requesting: adderall Contract:09/25/23 UDS:09/26/23 Last Visit:09/25/23 Next Visit:n/a Last Refill:09/25/23  Please Advise meds pending

## 2023-12-12 ENCOUNTER — Encounter: Payer: Self-pay | Admitting: Family Medicine

## 2023-12-13 MED ORDER — AMPHETAMINE-DEXTROAMPHET ER 30 MG PO CP24: 30.0000 mg | ORAL_CAPSULE | ORAL | 0 refills | Status: DC

## 2023-12-13 MED ORDER — AMPHETAMINE-DEXTROAMPHET ER 10 MG PO CP24: 10.0000 mg | ORAL_CAPSULE | Freq: Every day | ORAL | 0 refills | Status: DC

## 2023-12-13 MED ORDER — AMPHETAMINE-DEXTROAMPHETAMINE 10 MG PO TABS: 10.0000 mg | ORAL_TABLET | Freq: Every day | ORAL | 0 refills | Status: DC

## 2023-12-13 NOTE — Telephone Encounter (Signed)
 Requesting:adderall Contract:yes  UDS:09/26/23 Last Visit:09/25/23 Next Visit: recommended 03/2024 Last Refill:11/06/23 (30,0)(30,0)(30,0)  Please Advise

## 2024-01-20 ENCOUNTER — Encounter: Payer: Self-pay | Admitting: Family Medicine

## 2024-01-20 MED ORDER — AMPHETAMINE-DEXTROAMPHETAMINE 10 MG PO TABS: 10.0000 mg | ORAL_TABLET | Freq: Every day | ORAL | 0 refills | Status: DC

## 2024-01-20 MED ORDER — AMPHETAMINE-DEXTROAMPHET ER 10 MG PO CP24: 10.0000 mg | ORAL_CAPSULE | Freq: Every day | ORAL | 0 refills | Status: DC

## 2024-01-20 MED ORDER — AMPHETAMINE-DEXTROAMPHET ER 30 MG PO CP24: 30.0000 mg | ORAL_CAPSULE | ORAL | 0 refills | Status: DC

## 2024-01-20 NOTE — Telephone Encounter (Signed)
 Requesting:adderall Contract:yes  UDS:09/26/23 Last Visit:09/25/23 Next Visit: recommended 03/2024 Last Refill:12/13/23 (30,0)(30,0)(30,0)

## 2024-02-27 ENCOUNTER — Encounter: Payer: Self-pay | Admitting: Family Medicine

## 2024-02-27 MED ORDER — AMPHETAMINE-DEXTROAMPHETAMINE 10 MG PO TABS: 10.0000 mg | ORAL_TABLET | Freq: Every day | ORAL | 0 refills | Status: AC

## 2024-02-27 MED ORDER — AMPHETAMINE-DEXTROAMPHET ER 10 MG PO CP24: 10.0000 mg | ORAL_CAPSULE | Freq: Every day | ORAL | 0 refills | Status: AC

## 2024-02-27 MED ORDER — AMPHETAMINE-DEXTROAMPHET ER 30 MG PO CP24: 30.0000 mg | ORAL_CAPSULE | ORAL | 0 refills | Status: AC

## 2024-02-27 NOTE — Telephone Encounter (Signed)
 Rx's sent. F/u 1 mo.
